# Patient Record
Sex: Male | Born: 1960 | Hispanic: No | Marital: Married | Smoking: Never smoker
Health system: Southern US, Community
[De-identification: ages and names within clinical notes are randomized; demographics above are authoritative.]

## PROBLEM LIST (undated history)

## (undated) DIAGNOSIS — R3129 Other microscopic hematuria: Secondary | ICD-10-CM

## (undated) DIAGNOSIS — E291 Testicular hypofunction: Secondary | ICD-10-CM

## (undated) DIAGNOSIS — R918 Other nonspecific abnormal finding of lung field: Secondary | ICD-10-CM

## (undated) DIAGNOSIS — E785 Hyperlipidemia, unspecified: Secondary | ICD-10-CM

## (undated) DIAGNOSIS — E039 Hypothyroidism, unspecified: Secondary | ICD-10-CM

## (undated) HISTORY — DX: Other nonspecific abnormal finding of lung field: R91.8

## (undated) HISTORY — DX: Other microscopic hematuria: R31.29

## (undated) HISTORY — DX: Testicular hypofunction: E29.1

## (undated) HISTORY — DX: Hypothyroidism, unspecified: E03.9

## (undated) HISTORY — DX: Hyperlipidemia, unspecified: E78.5

---

## 2013-08-30 ENCOUNTER — Encounter: Payer: Self-pay | Admitting: Sports Medicine

## 2013-08-30 ENCOUNTER — Ambulatory Visit (INDEPENDENT_AMBULATORY_CARE_PROVIDER_SITE_OTHER): Payer: BC Managed Care – PPO | Admitting: Sports Medicine

## 2013-08-30 VITALS — BP 124/74 | HR 62 | Ht 67.0 in | Wt 189.0 lb

## 2013-08-30 DIAGNOSIS — Z0000 Encounter for general adult medical examination without abnormal findings: Secondary | ICD-10-CM | POA: Insufficient documentation

## 2013-08-30 DIAGNOSIS — E291 Testicular hypofunction: Secondary | ICD-10-CM

## 2013-08-30 DIAGNOSIS — E038 Other specified hypothyroidism: Secondary | ICD-10-CM

## 2013-08-30 DIAGNOSIS — E039 Hypothyroidism, unspecified: Secondary | ICD-10-CM

## 2013-08-30 DIAGNOSIS — Z299 Encounter for prophylactic measures, unspecified: Secondary | ICD-10-CM

## 2013-08-30 DIAGNOSIS — H6123 Impacted cerumen, bilateral: Secondary | ICD-10-CM

## 2013-08-30 DIAGNOSIS — E785 Hyperlipidemia, unspecified: Secondary | ICD-10-CM

## 2013-08-30 DIAGNOSIS — H6120 Impacted cerumen, unspecified ear: Secondary | ICD-10-CM

## 2013-08-30 NOTE — Assessment & Plan Note (Signed)
Removal by physician with curette and flushing.

## 2013-08-30 NOTE — Progress Notes (Signed)
  Subjective:    CC: Establish care.   HPI:  This is a healthy 53 year old male, he had a colonoscopy last year. He has no complaints, he is adverse to taking those medications, but wants a complete set of lab tests.  Past medical history, Surgical history, Family history not pertinant except as noted below, Social history, Allergies, and medications have been entered into the medical record, reviewed, and no changes needed.   Review of Systems: No headache, visual changes, nausea, vomiting, diarrhea, constipation, dizziness, abdominal pain, skin rash, fevers, chills, night sweats, swollen lymph nodes, weight loss, chest pain, body aches, joint swelling, muscle aches, shortness of breath, mood changes, visual or auditory hallucinations.  Objective:    General: Well Developed, well nourished, and in no acute distress.  Neuro: Alert and oriented x3, extra-ocular muscles intact, sensation grossly intact.  HEENT: Normocephalic, atraumatic, pupils equal round reactive to light, neck supple, no masses, no lymphadenopathy, thyroid nonpalpable. Bilateral occlusion of both tympanic membranes with cerumen. Oropharynx and nasopharynx unremarkable. Skin: Warm and dry, no rashes noted.  Cardiac: Regular rate and rhythm, no murmurs rubs or gallops.  Respiratory: Clear to auscultation bilaterally. Not using accessory muscles, speaking in full sentences.  Abdominal: Soft, nontender, nondistended, positive bowel sounds, no masses, no organomegaly.  Musculoskeletal: Shoulder, elbow, wrist, hip, knee, ankle stable, and with full range of motion.  Indication: Cerumen impaction of the ear(s) Medical necessity statement: On physical examination, cerumen impairs clinically significant portions of the external auditory canal, and tympanic membrane. Noted obstructive, copious cerumen that cannot be removed without magnification and instrumentations requiring physician skills Consent: Discussed benefits and risks of  procedure and verbal consent obtained Procedure: Patient was prepped for the procedure. Utilized an otoscope to assess and take note of the ear canal, the tympanic membrane, and the presence, amount, and placement of the cerumen. Gentle water irrigation and soft plastic curette was utilized to remove cerumen.  Post procedure examination: shows cerumen was completely removed. Patient tolerated procedure well. The patient is made aware that they may experience temporary vertigo, temporary hearing loss, and temporary discomfort. If these symptom last for more than 24 hours to call the clinic or proceed to the ED.  Impression and Recommendations:    The patient was counselled, risk factors were discussed, anticipatory guidance given.

## 2013-08-30 NOTE — Assessment & Plan Note (Signed)
Up-to-date on colonoscopy last year. Checking routine blood work. He is going to come see me next week for a complete physical.

## 2013-09-02 LAB — COMPREHENSIVE METABOLIC PANEL
AST: 26 U/L (ref 0–37)
BUN: 19 mg/dL (ref 6–23)
CO2: 29 mEq/L (ref 19–32)
Calcium: 9.2 mg/dL (ref 8.4–10.5)
Chloride: 105 mEq/L (ref 96–112)
Creat: 1.27 mg/dL (ref 0.50–1.35)
Glucose, Bld: 102 mg/dL — ABNORMAL HIGH (ref 70–99)
Total Bilirubin: 0.7 mg/dL (ref 0.2–1.2)

## 2013-09-02 LAB — LIPID PANEL
Cholesterol: 204 mg/dL — ABNORMAL HIGH (ref 0–200)
HDL: 35 mg/dL — ABNORMAL LOW (ref >39)
LDL Cholesterol: 145 mg/dL — ABNORMAL HIGH (ref 0–99)
Total CHOL/HDL Ratio: 5.8 ratio
Triglycerides: 120 mg/dL (ref <150)
VLDL: 24 mg/dL (ref 0–40)

## 2013-09-02 LAB — CBC
HCT: 45.5 % (ref 39.0–52.0)
Hemoglobin: 15.6 g/dL (ref 13.0–17.0)
MCH: 30.4 pg (ref 26.0–34.0)
MCHC: 34.3 g/dL (ref 30.0–36.0)
MCV: 88.5 fL (ref 78.0–100.0)
Platelets: 193 K/uL (ref 150–400)
RBC: 5.14 MIL/uL (ref 4.22–5.81)
RDW: 13.3 % (ref 11.5–15.5)
WBC: 6.2 K/uL (ref 4.0–10.5)

## 2013-09-02 LAB — COMPREHENSIVE METABOLIC PANEL WITH GFR
ALT: 26 U/L (ref 0–53)
Albumin: 4.5 g/dL (ref 3.5–5.2)
Alkaline Phosphatase: 65 U/L (ref 39–117)
Potassium: 5.2 meq/L (ref 3.5–5.3)
Sodium: 139 meq/L (ref 135–145)
Total Protein: 6.6 g/dL (ref 6.0–8.3)

## 2013-09-03 DIAGNOSIS — E291 Testicular hypofunction: Secondary | ICD-10-CM | POA: Insufficient documentation

## 2013-09-03 DIAGNOSIS — E038 Other specified hypothyroidism: Secondary | ICD-10-CM

## 2013-09-03 DIAGNOSIS — E039 Hypothyroidism, unspecified: Secondary | ICD-10-CM | POA: Insufficient documentation

## 2013-09-03 DIAGNOSIS — E785 Hyperlipidemia, unspecified: Secondary | ICD-10-CM | POA: Insufficient documentation

## 2013-09-03 HISTORY — DX: Hyperlipidemia, unspecified: E78.5

## 2013-09-03 HISTORY — DX: Other specified hypothyroidism: E03.8

## 2013-09-03 HISTORY — DX: Testicular hypofunction: E29.1

## 2013-09-03 LAB — PSA, TOTAL AND FREE
PSA, Free Pct: 49 % (ref >25)
PSA, Free: 0.4 ng/mL
PSA: 0.82 ng/mL (ref <=4.00)

## 2013-09-03 LAB — TSH: TSH: 5.19 u[IU]/mL — ABNORMAL HIGH (ref 0.350–4.500)

## 2013-09-03 LAB — HEMOGLOBIN A1C
Hgb A1c MFr Bld: 5.5 % (ref <5.7)
Mean Plasma Glucose: 111 mg/dL (ref <117)

## 2013-09-03 LAB — TESTOSTERONE: Testosterone: 208 ng/dL — ABNORMAL LOW (ref 300–890)

## 2013-09-27 ENCOUNTER — Encounter: Payer: Self-pay | Admitting: Sports Medicine

## 2013-09-27 ENCOUNTER — Ambulatory Visit (INDEPENDENT_AMBULATORY_CARE_PROVIDER_SITE_OTHER): Payer: BC Managed Care – PPO | Admitting: Sports Medicine

## 2013-09-27 VITALS — BP 125/81 | HR 71 | Ht 67.0 in | Wt 173.0 lb

## 2013-09-27 DIAGNOSIS — E291 Testicular hypofunction: Secondary | ICD-10-CM

## 2013-09-27 DIAGNOSIS — H6120 Impacted cerumen, unspecified ear: Secondary | ICD-10-CM | POA: Diagnosis not present

## 2013-09-27 DIAGNOSIS — Z299 Encounter for prophylactic measures, unspecified: Secondary | ICD-10-CM

## 2013-09-27 DIAGNOSIS — Z1211 Encounter for screening for malignant neoplasm of colon: Secondary | ICD-10-CM

## 2013-09-27 DIAGNOSIS — E038 Other specified hypothyroidism: Secondary | ICD-10-CM

## 2013-09-27 DIAGNOSIS — Z0000 Encounter for general adult medical examination without abnormal findings: Secondary | ICD-10-CM | POA: Diagnosis not present

## 2013-09-27 DIAGNOSIS — H6123 Impacted cerumen, bilateral: Secondary | ICD-10-CM

## 2013-09-27 DIAGNOSIS — E039 Hypothyroidism, unspecified: Secondary | ICD-10-CM

## 2013-09-27 DIAGNOSIS — E785 Hyperlipidemia, unspecified: Secondary | ICD-10-CM

## 2013-09-27 MED ORDER — RED YEAST RICE 600 MG PO TABS: 2.0000 | ORAL_TABLET | Freq: Three times a day (TID) | ORAL | Status: AC

## 2013-09-27 NOTE — Assessment & Plan Note (Signed)
Asymptomatic, he does not desire to pursue treatment.

## 2013-09-27 NOTE — Assessment & Plan Note (Signed)
Asymptomatic, no treatment needed. Advised weight loss and resistance exercises.

## 2013-09-27 NOTE — Assessment & Plan Note (Signed)
Very mildly elevated. We are going to try red rice yeast extract and diet modification.

## 2013-09-27 NOTE — Progress Notes (Addendum)
  Subjective:    CC: Complete physical  HPI:  Hyperlipidemia: Desires to try dietary modification first.  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  Subclinical hypothyroidism: Declines treatment, we did discuss the risks and increased cardiovascular morbidity and mortality with untreated subclinical hyperthyroidism.  Past medical history, Surgical history, Family history not pertinant except as noted below, Social history, Allergies, and medications have been entered into the medical record, reviewed, and no changes needed.   Review of Systems: No headache, visual changes, nausea, vomiting, diarrhea, constipation, dizziness, abdominal pain, skin rash, fevers, chills, night sweats, swollen lymph nodes, weight loss, chest pain, body aches, joint swelling, muscle aches, shortness of breath, mood changes, visual or auditory hallucinations.  Objective:    General: Well Developed, well nourished, and in no acute distress.  Neuro: Alert and oriented x3, extra-ocular muscles intact, sensation grossly intact. Cranial nerves II through XII are intact, motor, sensory, and coordinative functions are all intact. HEENT: Normocephalic, atraumatic, pupils equal round reactive to light, neck supple, no masses, no lymphadenopathy, thyroid nonpalpable. Oropharynx, nasopharynx, external ear canals occluded with cerumen. Skin: Warm and dry, no rashes noted.  Cardiac: Regular rate and rhythm, no murmurs rubs or gallops.  Respiratory: Clear to auscultation bilaterally. Not using accessory muscles, speaking in full sentences.  Abdominal: Soft, nontender, nondistended, positive bowel sounds, no masses, no organomegaly.  Musculoskeletal: Shoulder, elbow, wrist, hip, knee, ankle stable, and with full range of motion. Rectal: Good tone, smooth prostate, Hemoccult negative.  Indication: Cerumen impaction of the ear(s) Medical necessity  statement: On physical examination, cerumen impairs clinically significant portions of the external auditory canal, and tympanic membrane. Noted obstructive, copious cerumen that cannot be removed without magnification and instrumentations requiring physician skills Consent: Discussed benefits and risks of procedure and verbal consent obtained Procedure: Patient was prepped for the procedure. Utilized an otoscope to assess and take note of the ear canal, the tympanic membrane, and the presence, amount, and placement of the cerumen. Gentle water irrigation and soft plastic curette was utilized to remove cerumen.  Post procedure examination: shows cerumen was completely removed. Patient tolerated procedure well. The patient is made aware that they may experience temporary vertigo, temporary hearing loss, and temporary discomfort. If these symptom last for more than 24 hours to call the clinic or proceed to the ED.  Impression and Recommendations:    The patient was counselled, risk factors were discussed, anticipatory guidance given.

## 2013-09-27 NOTE — Assessment & Plan Note (Signed)
Complete physical performed today. 

## 2013-09-27 NOTE — Assessment & Plan Note (Signed)
Repeat irrigation bilaterally.

## 2013-12-30 ENCOUNTER — Encounter: Payer: Self-pay | Admitting: Sports Medicine

## 2013-12-30 ENCOUNTER — Ambulatory Visit (INDEPENDENT_AMBULATORY_CARE_PROVIDER_SITE_OTHER): Payer: BC Managed Care – PPO | Admitting: Sports Medicine

## 2013-12-30 VITALS — BP 124/84 | HR 67 | Ht 67.0 in

## 2013-12-30 DIAGNOSIS — R3129 Other microscopic hematuria: Secondary | ICD-10-CM | POA: Insufficient documentation

## 2013-12-30 DIAGNOSIS — Z0000 Encounter for general adult medical examination without abnormal findings: Secondary | ICD-10-CM | POA: Diagnosis not present

## 2013-12-30 DIAGNOSIS — Z23 Encounter for immunization: Secondary | ICD-10-CM

## 2013-12-30 DIAGNOSIS — E785 Hyperlipidemia, unspecified: Secondary | ICD-10-CM | POA: Diagnosis not present

## 2013-12-30 DIAGNOSIS — R312 Other microscopic hematuria: Secondary | ICD-10-CM | POA: Diagnosis not present

## 2013-12-30 DIAGNOSIS — E038 Other specified hypothyroidism: Secondary | ICD-10-CM | POA: Diagnosis not present

## 2013-12-30 DIAGNOSIS — E039 Hypothyroidism, unspecified: Secondary | ICD-10-CM

## 2013-12-30 HISTORY — DX: Other microscopic hematuria: R31.29

## 2013-12-30 NOTE — Assessment & Plan Note (Signed)
Rechecking lipid panel 

## 2013-12-30 NOTE — Assessment & Plan Note (Signed)
TDap today. Declines influenza vaccine. Colonoscopy was in 2014.

## 2013-12-30 NOTE — Assessment & Plan Note (Addendum)
Urinalysis, bmet. If still positive we will CT scan.  Persistent, ordering CT with IV contrast.

## 2013-12-30 NOTE — Progress Notes (Signed)
Patient refused to get weight checked. Shaka Zech,CMA

## 2013-12-30 NOTE — Progress Notes (Signed)
  Subjective:    CC: follow-up  HPI: Microscopic hematuria: Noted in [place], has not had any imaging studies, he does desire to recheck his urine appropriately. He is asymptomatic.  Hyperlipidemia: Lonia FarberConstantine does desire a fairly minimalistic approach to this, he has declined statin medications, we have had him on red rice yeast extract, he is due for a recheck, and his fasting at this time.  He tells me his wife did the same regimen and her lipids decreased significantly.  Preventative measures: Colonoscopy was in 2014, due for tetanus vaccine, declines influenza vaccine.  Past medical history, Surgical history, Family history not pertinant except as noted below, Social history, Allergies, and medications have been entered into the medical record, reviewed, and no changes needed.   Review of Systems: No fevers, chills, night sweats, weight loss, chest pain, or shortness of breath.   Objective:    General: Well Developed, well nourished, and in no acute distress.  Neuro: Alert and oriented x3, extra-ocular muscles intact, sensation grossly intact.  HEENT: Normocephalic, atraumatic, pupils equal round reactive to light, neck supple, no masses, no lymphadenopathy, thyroid nonpalpable.  Skin: Warm and dry, no rashes. Cardiac: Regular rate and rhythm, no murmurs rubs or gallops, no lower extremity edema.  Respiratory: Clear to auscultation bilaterally. Not using accessory muscles, speaking in full sentences.  Impression and Recommendations:

## 2013-12-30 NOTE — Assessment & Plan Note (Signed)
Per patient request we did not treat this, he was informed of the cardiovascular mortality risk associated with untreated subclinical hypothyroidism. We will recheck his TSH today.

## 2013-12-31 ENCOUNTER — Telehealth: Payer: Self-pay | Admitting: *Deleted

## 2013-12-31 LAB — URINALYSIS
Bilirubin Urine: NEGATIVE
Glucose, UA: NEGATIVE mg/dL
Ketones, ur: NEGATIVE mg/dL
Leukocytes, UA: NEGATIVE
Nitrite: NEGATIVE
Protein, ur: 30 mg/dL — AB
Specific Gravity, Urine: 1.022 (ref 1.005–1.030)
Urobilinogen, UA: 0.2 mg/dL (ref 0.0–1.0)
pH: 5 (ref 5.0–8.0)

## 2013-12-31 LAB — BASIC METABOLIC PANEL WITH GFR
CO2: 29 meq/L (ref 19–32)
Chloride: 102 meq/L (ref 96–112)
Glucose, Bld: 93 mg/dL (ref 70–99)
Potassium: 5 meq/L (ref 3.5–5.3)
Sodium: 140 meq/L (ref 135–145)

## 2013-12-31 LAB — LIPID PANEL
Cholesterol: 228 mg/dL — ABNORMAL HIGH (ref 0–200)
HDL: 37 mg/dL — ABNORMAL LOW (ref >39)
LDL Cholesterol: 161 mg/dL — ABNORMAL HIGH (ref 0–99)
Total CHOL/HDL Ratio: 6.2 ratio
Triglycerides: 148 mg/dL (ref <150)
VLDL: 30 mg/dL (ref 0–40)

## 2013-12-31 LAB — T4, FREE: Free T4: 1.33 ng/dL (ref 0.80–1.80)

## 2013-12-31 LAB — TSH: TSH: 6.126 u[IU]/mL — ABNORMAL HIGH (ref 0.350–4.500)

## 2013-12-31 LAB — T3, FREE: T3, Free: 3.7 pg/mL (ref 2.3–4.2)

## 2013-12-31 LAB — BASIC METABOLIC PANEL
BUN: 18 mg/dL (ref 6–23)
Calcium: 9.7 mg/dL (ref 8.4–10.5)
Creat: 1.08 mg/dL (ref 0.50–1.35)

## 2013-12-31 NOTE — Addendum Note (Signed)
Addended by: Monica BectonHEKKEKANDAM, THOMAS J on: 12/31/2013 12:51 PM   Modules accepted: Orders

## 2013-12-31 NOTE — Telephone Encounter (Signed)
Ct approval [id number] expires 01/29/14. Corliss SkainsJamie Ramiz Turpin, CMA

## 2014-01-24 ENCOUNTER — Encounter: Payer: Self-pay | Admitting: Sports Medicine

## 2014-01-24 ENCOUNTER — Ambulatory Visit (INDEPENDENT_AMBULATORY_CARE_PROVIDER_SITE_OTHER): Payer: BC Managed Care – PPO

## 2014-01-24 DIAGNOSIS — R918 Other nonspecific abnormal finding of lung field: Secondary | ICD-10-CM | POA: Insufficient documentation

## 2014-01-24 DIAGNOSIS — R3129 Other microscopic hematuria: Secondary | ICD-10-CM

## 2014-01-24 DIAGNOSIS — N281 Cyst of kidney, acquired: Secondary | ICD-10-CM

## 2014-01-24 HISTORY — DX: Other nonspecific abnormal finding of lung field: R91.8

## 2014-01-24 MED ORDER — IOHEXOL 300 MG/ML  SOLN
100.0000 mL | Freq: Once | INTRAMUSCULAR | Status: AC | PRN
  Administered 2014-01-24: 100 mL via INTRAVENOUS

## 2014-04-01 ENCOUNTER — Encounter: Payer: Self-pay | Admitting: Sports Medicine

## 2014-04-01 ENCOUNTER — Ambulatory Visit (INDEPENDENT_AMBULATORY_CARE_PROVIDER_SITE_OTHER): Payer: BLUE CROSS/BLUE SHIELD | Admitting: Sports Medicine

## 2014-04-01 VITALS — BP 115/74 | HR 67 | Ht 67.0 in | Wt 172.0 lb

## 2014-04-01 DIAGNOSIS — R312 Other microscopic hematuria: Secondary | ICD-10-CM | POA: Diagnosis not present

## 2014-04-01 DIAGNOSIS — E039 Hypothyroidism, unspecified: Secondary | ICD-10-CM

## 2014-04-01 DIAGNOSIS — R918 Other nonspecific abnormal finding of lung field: Secondary | ICD-10-CM

## 2014-04-01 DIAGNOSIS — E785 Hyperlipidemia, unspecified: Secondary | ICD-10-CM | POA: Diagnosis not present

## 2014-04-01 DIAGNOSIS — E038 Other specified hypothyroidism: Secondary | ICD-10-CM | POA: Diagnosis not present

## 2014-04-01 DIAGNOSIS — R3129 Other microscopic hematuria: Secondary | ICD-10-CM

## 2014-04-01 MED ORDER — ATORVASTATIN CALCIUM 20 MG PO TABS: 20.0000 mg | ORAL_TABLET | Freq: Every day | ORAL | Status: DC

## 2014-04-01 NOTE — Assessment & Plan Note (Signed)
Several renal cysts on CT scan, due to the likely cause of his hematuria, no suspicious normalities.

## 2014-04-01 NOTE — Assessment & Plan Note (Signed)
Normal T3 and T4, no further intervention needed, patient is fully aware of the increase in cardiovascular mortality with untreated subclinical hypothyroidism.

## 2014-04-01 NOTE — Progress Notes (Signed)
  Subjective:    CC: Follow-up  HPI: Pulmonary nodules: Nonsmoker, repeat CT scan at the end of this year.  Microscopic hematuria: CT scan did show some renal cysts, no evidence of malignancy.  Hyperlipidemia: Cholesterol has increased despite a low cholesterol diet. Amenable to try Lipitor.  Past medical history, Surgical history, Family history not pertinant except as noted below, Social history, Allergies, and medications have been entered into the medical record, reviewed, and no changes needed.   Review of Systems: No fevers, chills, night sweats, weight loss, chest pain, or shortness of breath.   Objective:    General: Well Developed, well nourished, and in no acute distress.  Neuro: Alert and oriented x3, extra-ocular muscles intact, sensation grossly intact.  HEENT: Normocephalic, atraumatic, pupils equal round reactive to light, neck supple, no masses, no lymphadenopathy, thyroid nonpalpable.  Skin: Warm and dry, no rashes. Cardiac: Regular rate and rhythm, no murmurs rubs or gallops, no lower extremity edema.  Respiratory: Clear to auscultation bilaterally. Not using accessory muscles, speaking in full sentences.  Impression and Recommendations:

## 2014-04-01 NOTE — Assessment & Plan Note (Signed)
It has now been a long time, lipids continue to be elevated. Red rice yeast extract has not been effective. He finally agrees to start Lipitor. Recheck lipids in 3 months.

## 2014-04-01 NOTE — Assessment & Plan Note (Signed)
Will need a repeat CT chest in December 2016

## 2014-04-02 LAB — COMPREHENSIVE METABOLIC PANEL
Albumin: 4.8 g/dL (ref 3.5–5.2)
Alkaline Phosphatase: 63 U/L (ref 39–117)
BUN: 20 mg/dL (ref 6–23)
CO2: 33 mEq/L — ABNORMAL HIGH (ref 19–32)
Calcium: 9.7 mg/dL (ref 8.4–10.5)
Chloride: 102 mEq/L (ref 96–112)
Glucose, Bld: 87 mg/dL (ref 70–99)
Potassium: 4.8 mEq/L (ref 3.5–5.3)
Sodium: 141 mEq/L (ref 135–145)
Total Bilirubin: 0.8 mg/dL (ref 0.2–1.2)
Total Protein: 6.8 g/dL (ref 6.0–8.3)

## 2014-04-02 LAB — LIPID PANEL
Cholesterol: 210 mg/dL — ABNORMAL HIGH (ref 0–200)
HDL: 41 mg/dL (ref >39)
LDL Cholesterol: 154 mg/dL — ABNORMAL HIGH (ref 0–99)
Total CHOL/HDL Ratio: 5.1 ratio
Triglycerides: 75 mg/dL (ref <150)
VLDL: 15 mg/dL (ref 0–40)

## 2014-04-02 LAB — COMPREHENSIVE METABOLIC PANEL WITH GFR
ALT: 27 U/L (ref 0–53)
AST: 30 U/L (ref 0–37)
Creat: 1.08 mg/dL (ref 0.50–1.35)

## 2014-05-12 ENCOUNTER — Telehealth: Payer: Self-pay | Admitting: *Deleted

## 2014-05-12 ENCOUNTER — Ambulatory Visit (INDEPENDENT_AMBULATORY_CARE_PROVIDER_SITE_OTHER): Payer: BLUE CROSS/BLUE SHIELD | Admitting: Family Medicine

## 2014-05-12 ENCOUNTER — Encounter: Payer: Self-pay | Admitting: Family Medicine

## 2014-05-12 VITALS — BP 119/71 | HR 68 | Wt 172.0 lb

## 2014-05-12 DIAGNOSIS — K648 Other hemorrhoids: Secondary | ICD-10-CM

## 2014-05-12 DIAGNOSIS — K644 Residual hemorrhoidal skin tags: Secondary | ICD-10-CM

## 2014-05-12 MED ORDER — WITCH HAZEL-GLYCERIN EX PADS
1.0000 | MEDICATED_PAD | CUTANEOUS | Status: AC | PRN
Start: 2014-05-12 — End: ?

## 2014-05-12 MED ORDER — HYDROCORTISONE ACETATE 25 MG RE SUPP: 25.0000 mg | Freq: Two times a day (BID) | RECTAL | Status: AC | PRN

## 2014-05-12 MED ORDER — HYDROCORTISONE ACE-PRAMOXINE 1-1 % RE FOAM: 1.0000 | Freq: Two times a day (BID) | RECTAL | Status: DC

## 2014-05-12 NOTE — Telephone Encounter (Signed)
Switch to Anusol HC suppository and topical witch hazel wipes, he should also use a stool softener such as Colace 100 mg twice a day if he strains at all to stool.

## 2014-05-12 NOTE — Patient Instructions (Signed)
Begin taking 1-3 heaping teaspoons of Psyllium powder mixed in water (available over the counter) for the next week.  Also take two warm baths for at least 10 minutes daily to help shrink the site of swelling.  A Rx of a anti-inflammatory was sent to [facility].

## 2014-05-12 NOTE — Telephone Encounter (Signed)
Pharm called stating that the procto-foam is requiring a PA.  Do you want to change this to something different or us work on the PA? Please advise.

## 2014-05-12 NOTE — Progress Notes (Signed)
CC: Dale Ruiz is a 54 y.o. male is here for piles   Subjective: HPI:  Swelling and tenderness at the rectum that has been present for one week. It's worse with sitting on the rear end or when manipulating the mass at the rectum. It seems to be soft but very tender. No benefit from preparation H topical wipes. No other interventions as of yet. He denies straining or constipation with respect to defecation. There's been no abdominal pain. He denies any surrounding skin lesions or diarrhea. He denies any blood in the stool nor melena. Denies any abdominal discomfort. He tells me he had a colonoscopy within the last year that showed internal hemorrhoids and no other abnormality that he knows of.   Review Of Systems Outlined In HPI  No past medical history on file.  No past surgical history on file. No family history on file.  History   Social History  . Marital Status: Married    Spouse Name: N/A  . Number of Children: N/A  . Years of Education: N/A   Occupational History  . Not on file.   Social History Main Topics  . Smoking status: Never Smoker   . Smokeless tobacco: Not on file  . Alcohol Use: Not on file  . Drug Use: Not on file  . Sexual Activity: Not on file   Other Topics Concern  . Not on file   Social History Narrative     Objective: BP 119/71 mmHg  Pulse 68  Wt 172 lb (78.019 kg)  Vital signs reviewed. General: Alert and Oriented, No Acute Distress HEENT: Pupils equal, round, reactive to light. Conjunctivae clear.  External ears unremarkable.  Moist mucous membranes. Lungs: Clear and comfortable work of breathing, speaking in full sentences without accessory muscle use. Cardiac: Regular rate and rhythm.  Neuro: CN II-XII grossly intact, gait normal. Extremities: No peripheral edema.  Strong peripheral pulses.  Mental Status: No depression, anxiety, nor agitation. Logical though process. Rectal: Normal rectal tone, at the 12:00 position of the anus  there is a tender soft compressible 1.5 cm diameter fleshycolored hernia.no evidence of thrombosis. Skin: Warm and dry.  Assessment & Plan: Tora KindredConstantin was seen today for piles.  Diagnoses and all orders for this visit:  External hemorrhoid Orders: -     Discontinue: hydrocortisone-pramoxine (PROCTOFOAM HC) rectal foam; Place 1 applicator rectally 2 (two) times daily. For one week.   External hemorrhoids: Discussed comfortable seating, increasing fiber specifically with psyllium powder, sitz baths, and topical hydrocortisone cream. Discussed it will take at least a week for this to improve if he can follow through with the above recommendations. If no benefit in 2 weeks follow-up with PCP first second opinion on management.  Return in about 2 weeks (around 05/26/2014) for with PCP if no better..Marland Kitchen

## 2014-06-30 ENCOUNTER — Ambulatory Visit (INDEPENDENT_AMBULATORY_CARE_PROVIDER_SITE_OTHER): Payer: BLUE CROSS/BLUE SHIELD | Admitting: Sports Medicine

## 2014-06-30 ENCOUNTER — Encounter: Payer: Self-pay | Admitting: Sports Medicine

## 2014-06-30 DIAGNOSIS — E785 Hyperlipidemia, unspecified: Secondary | ICD-10-CM | POA: Diagnosis not present

## 2014-06-30 NOTE — Assessment & Plan Note (Signed)
Has now been on Lipitor now for 3 months, rechecking fasting lipids.

## 2014-06-30 NOTE — Progress Notes (Signed)
  Subjective:    CC: follow-up  HPI: Hyperlipidemia: Doing well on atorvastatin, due for a recheck.  Past medical history, Surgical history, Family history not pertinant except as noted below, Social history, Allergies, and medications have been entered into the medical record, reviewed, and no changes needed.   Review of Systems: No fevers, chills, night sweats, weight loss, chest pain, or shortness of breath.   Objective:    General: Well Developed, well nourished, and in no acute distress.  Neuro: Alert and oriented x3, extra-ocular muscles intact, sensation grossly intact.  HEENT: Normocephalic, atraumatic, pupils equal round reactive to light, neck supple, no masses, no lymphadenopathy, thyroid nonpalpable.  Skin: Warm and dry, no rashes. Cardiac: Regular rate and rhythm, no murmurs rubs or gallops, no lower extremity edema.  Respiratory: Clear to auscultation bilaterally. Not using accessory muscles, speaking in full sentences.  Impression and Recommendations:    I spent 40 minutes with this patient, 50% was face-to-face time counseling regarding the above diagnosis,as well as discussing aspects of his treatment, as well as future courses.

## 2014-07-02 LAB — LIPID PANEL
Cholesterol: 185 mg/dL (ref 0–200)
HDL: 40 mg/dL (ref >=40)
LDL Cholesterol: 124 mg/dL — ABNORMAL HIGH (ref 0–99)
Total CHOL/HDL Ratio: 4.6 ratio
Triglycerides: 107 mg/dL (ref <150)
VLDL: 21 mg/dL (ref 0–40)

## 2014-07-02 LAB — COMPREHENSIVE METABOLIC PANEL WITH GFR
ALT: 25 U/L (ref 0–53)
Albumin: 4.1 g/dL (ref 3.5–5.2)
Alkaline Phosphatase: 52 U/L (ref 39–117)
BUN: 20 mg/dL (ref 6–23)
Calcium: 9.3 mg/dL (ref 8.4–10.5)
Creat: 0.95 mg/dL (ref 0.50–1.35)

## 2014-07-02 LAB — COMPREHENSIVE METABOLIC PANEL
AST: 29 U/L (ref 0–37)
CO2: 26 mEq/L (ref 19–32)
Chloride: 106 mEq/L (ref 96–112)
Glucose, Bld: 121 mg/dL — ABNORMAL HIGH (ref 70–99)
Potassium: 4.8 mEq/L (ref 3.5–5.3)
Sodium: 142 mEq/L (ref 135–145)
Total Bilirubin: 0.6 mg/dL (ref 0.2–1.2)
Total Protein: 6.5 g/dL (ref 6.0–8.3)

## 2014-09-30 ENCOUNTER — Ambulatory Visit: Payer: BLUE CROSS/BLUE SHIELD | Admitting: Sports Medicine

## 2014-10-13 ENCOUNTER — Ambulatory Visit (INDEPENDENT_AMBULATORY_CARE_PROVIDER_SITE_OTHER): Payer: BLUE CROSS/BLUE SHIELD | Admitting: Sports Medicine

## 2014-10-13 ENCOUNTER — Encounter: Payer: Self-pay | Admitting: Sports Medicine

## 2014-10-13 VITALS — BP 110/70 | HR 65 | Ht 67.0 in | Wt 171.0 lb

## 2014-10-13 DIAGNOSIS — E785 Hyperlipidemia, unspecified: Secondary | ICD-10-CM | POA: Diagnosis not present

## 2014-10-13 MED ORDER — ATORVASTATIN CALCIUM 20 MG PO TABS: 20.0000 mg | ORAL_TABLET | Freq: Every day | ORAL | Status: AC

## 2014-10-13 NOTE — Assessment & Plan Note (Signed)
Doing extremely well, lipids have essentially normalized on 20 mg of Lipitor. Liver function is normal. Return in one year.

## 2014-10-13 NOTE — Progress Notes (Signed)
  Subjective:    CC: Follow-up  HPI: Hyperlipidemia: We discussed lipids, he is doing well on Lipitor, no refills needed, no further questions.  Past medical history, Surgical history, Family history not pertinant except as noted below, Social history, Allergies, and medications have been entered into the medical record, reviewed, and no changes needed.   Review of Systems: No fevers, chills, night sweats, weight loss, chest pain, or shortness of breath.   Objective:    General: Well Developed, well nourished, and in no acute distress.  Neuro: Alert and oriented x3, extra-ocular muscles intact, sensation grossly intact.  HEENT: Normocephalic, atraumatic, pupils equal round reactive to light, neck supple, no masses, no lymphadenopathy, thyroid nonpalpable.  Skin: Warm and dry, no rashes. Cardiac: Regular rate and rhythm, no murmurs rubs or gallops, no lower extremity edema.  Respiratory: Clear to auscultation bilaterally. Not using accessory muscles, speaking in full sentences.  Impression and Recommendations:

## 2015-03-12 ENCOUNTER — Encounter: Payer: Self-pay | Admitting: Sports Medicine

## 2015-03-12 ENCOUNTER — Ambulatory Visit (INDEPENDENT_AMBULATORY_CARE_PROVIDER_SITE_OTHER): Payer: BLUE CROSS/BLUE SHIELD

## 2015-03-12 ENCOUNTER — Ambulatory Visit (INDEPENDENT_AMBULATORY_CARE_PROVIDER_SITE_OTHER): Payer: BLUE CROSS/BLUE SHIELD | Admitting: Sports Medicine

## 2015-03-12 VITALS — BP 124/78 | HR 74 | Temp 97.9°F | Resp 18 | Wt 186.4 lb

## 2015-03-12 DIAGNOSIS — R059 Cough, unspecified: Secondary | ICD-10-CM

## 2015-03-12 DIAGNOSIS — H6123 Impacted cerumen, bilateral: Secondary | ICD-10-CM | POA: Diagnosis not present

## 2015-03-12 DIAGNOSIS — R05 Cough: Secondary | ICD-10-CM

## 2015-03-12 MED ORDER — PREDNISONE 50 MG PO TABS: ORAL_TABLET | ORAL | Status: DC

## 2015-03-12 MED ORDER — AZITHROMYCIN 250 MG PO TABS: ORAL_TABLET | ORAL | Status: DC

## 2015-03-12 NOTE — Progress Notes (Signed)
  Subjective:    CC: coughing  HPI: This is a pleasant 55 year old male, he comes in with a 4 week history of a dry, nonproductive cough, no fevers, chills, shortness of breath, chest pain. No rashes, no travel outside country. No night sweats.  Past medical history, Surgical history, Family history not pertinant except as noted below, Social history, Allergies, and medications have been entered into the medical record, reviewed, and no changes needed.   Review of Systems: No fevers, chills, night sweats, weight loss, chest pain, or shortness of breath.   Objective:    General: Well Developed, well nourished, and in no acute distress.  Neuro: Alert and oriented x3, extra-ocular muscles intact, sensation grossly intact.  HEENT: Normocephalic, atraumatic, pupils equal round reactive to light, neck supple, no masses, no lymphadenopathy, thyroid nonpalpable. Oropharynx, nasopharynx unremarkable, ear canal show cerumen occlusions Skin: Warm and dry, no rashes. Cardiac: Regular rate and rhythm, no murmurs rubs or gallops, no lower extremity edema.  Respiratory: coarse sounds in the right middle lobe. Not using accessory muscles, speaking in full sentences.  Indication: Cerumen impaction of the left and right ear(s) Medical necessity statement: On physical examination, cerumen impairs clinically significant portions of the external auditory canal, and tympanic membrane. Noted obstructive, copious cerumen that cannot be removed without magnification and instrumentations requiring physician skills Consent: Discussed benefits and risks of procedure and verbal consent obtained Procedure: Patient was prepped for the procedure. Utilized an otoscope to assess and take note of the ear canal, the tympanic membrane, and the presence, amount, and placement of the cerumen. Gentle water irrigation and soft plastic curette was utilized to remove cerumen.  Post procedure examination: shows cerumen was completely  removed. Patient tolerated procedure well. The patient is made aware that they may experience temporary vertigo, temporary hearing loss, and temporary discomfort. If these symptom last for more than 24 hours to call the clinic or proceed to the ED. Impression and Recommendations:

## 2015-03-12 NOTE — Assessment & Plan Note (Signed)
With right middle lobe coarse sounds. Present for one month, adding azithromycin, prednisone, chest x-ray. Return in 2 weeks, if no better we will consider advanced imaging and PFTs  He does have fairly marked bilateral cerumen impactions which may represent Arnold's reflex.

## 2015-03-26 ENCOUNTER — Ambulatory Visit (INDEPENDENT_AMBULATORY_CARE_PROVIDER_SITE_OTHER): Payer: BLUE CROSS/BLUE SHIELD | Admitting: Sports Medicine

## 2015-03-26 DIAGNOSIS — R05 Cough: Secondary | ICD-10-CM

## 2015-03-26 DIAGNOSIS — R059 Cough, unspecified: Secondary | ICD-10-CM

## 2015-03-26 MED ORDER — TIOTROPIUM BROMIDE-OLODATEROL 2.5-2.5 MCG/ACT IN AERS: 2.0000 | INHALATION_SPRAY | Freq: Every day | RESPIRATORY_TRACT | Status: DC

## 2015-03-26 MED ORDER — BENZONATATE 200 MG PO CAPS: 200.0000 mg | ORAL_CAPSULE | Freq: Three times a day (TID) | ORAL | Status: DC | PRN

## 2015-03-26 NOTE — Progress Notes (Signed)
  Subjective:    CC: Cough  HPI: Patient returns to clinic 1 month after receiving a Z-pack and steroid for a cough that had lasted 1 month at that point (2 months total to date). Patient says that his cough has not changed in quality or quantity and has not improved with previous regimen. He notices it the most when he steps in the cold air or when he drinks cold liquids. However, he does the have cough consistently when not exposed the cold. He does not notice that it is worse in the morning and denies any heart burn or chest pain of any kind. He says that occasionally bread feels like it "may get stuck" in his throat but no problems with other foods or with liquids. He denies fevers, weight loss, night sweats, or N/V. He denies a history of allergies, asthma, or cigarette use. He does not feel like he has a runny or stuffy nose. He denies any SOB.  Past medical history, Surgical history, Family history not pertinant except as noted below, Social history, Allergies, and medications have been entered into the medical record, reviewed, and no changes needed.   Review of Systems: No headache, visual changes, weakness, muscle aches, diarrhea, or constipation.  Objective:    General: Well Developed, well nourished, and in no acute distress.  Neuro: Alert and oriented x3, extra-ocular muscles intact, sensation grossly intact.  HEENT: Normocephalic, atraumatic, pupils equal round reactive to light, neck supple, no masses, no lymphadenopathy, thyroid nonpalpable, mild erythema of the nares, mild oropharyngeal erythema.  Skin: Warm and dry, no rashes. Cardiac: Regular rate and rhythm, no murmurs rubs or gallops, no lower extremity edema.  Respiratory: Clear to auscultation bilaterally. Not using accessory muscles, speaking in full sentences. Abdominal: NT/ND. Normoactive bowel sounds.  Impression and Recommendations:    Unclear etiology, however most likely post infectious vs. reactive ariway disease.  Patient given a Spiolto sample today and instructed to take 2 puffs/morning. GERD less likely given lack of N/V, chest pain, and association with recumbency or meals. Would consider more work up should patient not improve with time and therapy. No concern for malignancy at this time (no fevers, night sweats, or weight loss).

## 2015-03-26 NOTE — Assessment & Plan Note (Signed)
Persistent now for 2 months, unclear etiology however exam is benign, chest x-ray was normal. He did not respond to azithromycin or prednisone, this likely represents a simple postinfectious cough versus reactive airways versus upper airway cough syndrome. No symptoms of reflux, aspiration. No constitutional symptoms. Stiolto inhaler provided today, return to see me in one month. If still coughing we will probably consider pulmonology referral.

## 2015-08-17 ENCOUNTER — Encounter: Payer: Self-pay | Admitting: Sports Medicine

## 2015-08-17 ENCOUNTER — Ambulatory Visit (INDEPENDENT_AMBULATORY_CARE_PROVIDER_SITE_OTHER): Payer: BLUE CROSS/BLUE SHIELD | Admitting: Sports Medicine

## 2015-08-17 VITALS — BP 108/76 | HR 69 | Resp 18 | Wt 174.3 lb

## 2015-08-17 DIAGNOSIS — L237 Allergic contact dermatitis due to plants, except food: Secondary | ICD-10-CM | POA: Diagnosis not present

## 2015-08-17 MED ORDER — TRIAMCINOLONE ACETONIDE 0.5 % EX OINT: 1.0000 "application " | TOPICAL_OINTMENT | Freq: Two times a day (BID) | CUTANEOUS | Status: AC

## 2015-08-17 NOTE — Progress Notes (Signed)
  Subjective:    CC: Rash  HPI: 1 week ago this pleasant 55 year old male was working in his yard, he was wearing short sleeves and over the last week developed itching, and a reddish rash along the entirety of his arm. Symptoms are moderate, persistent, itching does not wake him up at night. He's tried some home remedies without much improvement.  Past medical history, Surgical history, Family history not pertinant except as noted below, Social history, Allergies, and medications have been entered into the medical record, reviewed, and no changes needed.   Review of Systems: No fevers, chills, night sweats, weight loss, chest pain, or shortness of breath.   Objective:    General: Well Developed, well nourished, and in no acute distress.  Neuro: Alert and oriented x3, extra-ocular muscles intact, sensation grossly intact.  HEENT: Normocephalic, atraumatic, pupils equal round reactive to light, neck supple, no masses, no lymphadenopathy, thyroid nonpalpable.  Skin: Warm and dry, Papules and macules in a linear fashion on the right arm consistent with poison ivy dermatitis, no evidence of bacterial superinfection. Cardiac: Regular rate and rhythm, no murmurs rubs or gallops, no lower extremity edema.  Respiratory: Clear to auscultation bilaterally. Not using accessory muscles, speaking in full sentences.  Impression and Recommendations:   I spent 25 minutes with this patient, greater than 50% was face-to-face time counseling regarding the above diagnoses

## 2015-08-17 NOTE — Patient Instructions (Signed)

## 2015-08-17 NOTE — Assessment & Plan Note (Signed)
Right arm and fairly extensive, adding triamcinolone ointment.

## 2015-08-19 ENCOUNTER — Encounter: Payer: Self-pay | Admitting: Sports Medicine

## 2015-08-19 MED ORDER — PREDNISONE 50 MG PO TABS: 50.0000 mg | ORAL_TABLET | Freq: Every day | ORAL | Status: DC

## 2015-08-19 NOTE — Telephone Encounter (Signed)
Pt advised of Rx sent in per PCP request.

## 2016-03-16 ENCOUNTER — Encounter: Payer: Self-pay | Admitting: Osteopathic Medicine

## 2016-03-16 ENCOUNTER — Ambulatory Visit (INDEPENDENT_AMBULATORY_CARE_PROVIDER_SITE_OTHER): Payer: BLUE CROSS/BLUE SHIELD | Admitting: Osteopathic Medicine

## 2016-03-16 VITALS — BP 127/70 | HR 80 | Temp 98.2°F

## 2016-03-16 DIAGNOSIS — R05 Cough: Secondary | ICD-10-CM

## 2016-03-16 DIAGNOSIS — J209 Acute bronchitis, unspecified: Secondary | ICD-10-CM

## 2016-03-16 DIAGNOSIS — R918 Other nonspecific abnormal finding of lung field: Secondary | ICD-10-CM | POA: Diagnosis not present

## 2016-03-16 DIAGNOSIS — R059 Cough, unspecified: Secondary | ICD-10-CM

## 2016-03-16 MED ORDER — TIOTROPIUM BROMIDE-OLODATEROL 2.5-2.5 MCG/ACT IN AERS: 2.0000 | INHALATION_SPRAY | Freq: Every day | RESPIRATORY_TRACT | 1 refills | Status: DC

## 2016-03-16 MED ORDER — BENZONATATE 200 MG PO CAPS: 200.0000 mg | ORAL_CAPSULE | Freq: Three times a day (TID) | ORAL | 1 refills | Status: DC | PRN

## 2016-03-16 NOTE — Progress Notes (Signed)
HPI: Dale Ruiz is a 56 y.o. male who [facility] [place] [facility] for chief complaint of:  Chief Complaint  Patient presents with  . Cough   Acute Illness: . Context: thinks may have gotten sick from sister . Quality: dry cough . Assoc signs/symptoms: see ROS . Duration: 14+ days . Modifying factors: has tried the following OTC/Rx medications: OTC cough medications have done little. Pt was given Stiolto 03/2015 for cough and this was helpful   Pulmonary nodule: Chronic issue: problem list reviewed, hx incidental pulm nodule finding on Abd/Pelv CT which was done for w/u hematuria - overdue for recheck CT. Never smoker.    Past medical, social and family history reviewed. Current medications and allergies reviewed.     Review of Systems:  Constitutional: No  fever/chills  HEENT: No  headache, No  sore throat, No  swollen glands  Cardiovascular: No chest pain  Respiratory:Yes  cough, No  shortness of breath  Gastrointestinal: No  nausea, No  vomiting,  No  diarrhea  Musculoskeletal:   No  myalgia/arthralgia  Skin/Integument:  No  rash   Exam:  BP 127/70   Pulse 80   Temp 98.2 F (36.8 C) (Oral)   Constitutional: VSS, see above. General Appearance: alert, well-developed, well-nourished, NAD  Eyes: Normal lids and conjunctive, non-icteric sclera, PERRLA  Ears, Nose, Mouth, Throat: Normal external inspection ears/nares/mouth/lips/gums, normal TM, MMM; posterior pharynx without erythema, without exudate, nasal mucosa normal  Neck: No masses, trachea midline. normal lymph nodes  Respiratory: Normal respiratory effort. No  wheeze/rhonchi/rales  Cardiovascular: S1/S2 normal, no murmur/rub/gallop auscultated. RRR.   CT images reviewed with the patient. All question answered.    ASSESSMENT/PLAN:  Acute bronchitis, unspecified organism - viral likely, pt requests Stiotlo wihch helped last time.   Cough - Plan:  Tiotropium Bromide-Olodaterol (STIOLTO RESPIMAT) 2.5-2.5 MCG/ACT AERS  Pulmonary nodules - unlikely related to current complaint but he is overdue for followup CT chest - incidental nodule finding on CT abd/pelvis in 01/2014 - Plan: CT CHEST WO CONTRAST    Visit summary was printed for the patient with medications and pertinent instructions for patient to review. ER/RTC precautions reviewed. All questions answered. Return if symptoms worsen or fail to improve. and is due for routine care with PCP

## 2016-03-16 NOTE — Patient Instructions (Signed)

## 2016-03-17 MED ORDER — TIOTROPIUM BROMIDE-OLODATEROL 2.5-2.5 MCG/ACT IN AERS: 2.0000 | INHALATION_SPRAY | Freq: Every day | RESPIRATORY_TRACT | 1 refills | Status: AC

## 2016-03-17 NOTE — Addendum Note (Signed)
Addended by: Deirdre PippinsALEXANDER, Kory Rains M on: 03/17/2016 08:43 AM   Modules accepted: Orders

## 2016-03-21 ENCOUNTER — Ambulatory Visit (INDEPENDENT_AMBULATORY_CARE_PROVIDER_SITE_OTHER): Payer: BLUE CROSS/BLUE SHIELD

## 2016-03-21 DIAGNOSIS — R918 Other nonspecific abnormal finding of lung field: Secondary | ICD-10-CM | POA: Diagnosis not present

## 2016-03-28 ENCOUNTER — Ambulatory Visit (INDEPENDENT_AMBULATORY_CARE_PROVIDER_SITE_OTHER): Payer: BLUE CROSS/BLUE SHIELD | Admitting: Sports Medicine

## 2016-03-28 ENCOUNTER — Encounter: Payer: Self-pay | Admitting: Sports Medicine

## 2016-03-28 VITALS — BP 113/73 | HR 89 | Resp 18 | Wt 166.5 lb

## 2016-03-28 DIAGNOSIS — Z0000 Encounter for general adult medical examination without abnormal findings: Secondary | ICD-10-CM

## 2016-03-28 DIAGNOSIS — Z1211 Encounter for screening for malignant neoplasm of colon: Secondary | ICD-10-CM

## 2016-03-28 DIAGNOSIS — E039 Hypothyroidism, unspecified: Secondary | ICD-10-CM | POA: Diagnosis not present

## 2016-03-28 DIAGNOSIS — E038 Other specified hypothyroidism: Secondary | ICD-10-CM

## 2016-03-28 LAB — HEMOCCULT GUIAC POC 1CARD (OFFICE)
Card #1 Date: 20518
Fecal Occult Blood, POC: NEGATIVE

## 2016-03-28 NOTE — Assessment & Plan Note (Signed)
Annual physical as above, blood work ordered. Adding a PSA, prostate was nodular

## 2016-03-28 NOTE — Progress Notes (Signed)
  Subjective:    CC: Annual physical  HPI:  This is a pleasant [age] her old male, here for his physical, no complaints.  Past medical history:  Negative.  See flowsheet/record as well for more information.  Surgical history: Negative.  See flowsheet/record as well for more information.  Family history: Negative.  See flowsheet/record as well for more information.  Social history: Negative.  See flowsheet/record as well for more information.  Allergies, and medications have been entered into the medical record, reviewed, and no changes needed.    Review of Systems: No headache, visual changes, nausea, vomiting, diarrhea, constipation, dizziness, abdominal pain, skin rash, fevers, chills, night sweats, swollen lymph nodes, weight loss, chest pain, body aches, joint swelling, muscle aches, shortness of breath, mood changes, visual or auditory hallucinations.  Objective:    General: Well Developed, well nourished, and in no acute distress.  Neuro: Alert and oriented x3, extra-ocular muscles intact, sensation grossly intact. Cranial nerves II through XII are intact, motor, sensory, and coordinative functions are all intact. HEENT: Normocephalic, atraumatic, pupils equal round reactive to light, neck supple, no masses, no lymphadenopathy, thyroid nonpalpable. Oropharynx, nasopharynx, external ear canals are unremarkable. Skin: Warm and dry, no rashes noted.  Cardiac: Regular rate and rhythm, no murmurs rubs or gallops.  Respiratory: Clear to auscultation bilaterally. Not using accessory muscles, speaking in full sentences.  Abdominal: Soft, nontender, nondistended, positive bowel sounds, no masses, no organomegaly.  Musculoskeletal: Shoulder, elbow, wrist, hip, knee, ankle stable, and with full range of motion. Rectal: Good tone, prostate soft and nodular, Hemoccult negative.  Impression and Recommendations:    The patient was counselled, risk factors were discussed, anticipatory guidance  given.  Annual physical exam Annual physical as above, blood work ordered. Adding a PSA, prostate was nodular

## 2016-03-29 LAB — COMPREHENSIVE METABOLIC PANEL WITH GFR
AST: 28 U/L (ref 10–35)
CO2: 25 mmol/L (ref 20–31)
Creat: 1.1 mg/dL (ref 0.70–1.33)
Glucose, Bld: 92 mg/dL (ref 65–99)
Sodium: 141 mmol/L (ref 135–146)
Total Bilirubin: 0.7 mg/dL (ref 0.2–1.2)

## 2016-03-29 LAB — COMPREHENSIVE METABOLIC PANEL
ALT: 19 U/L (ref 9–46)
Albumin: 4.4 g/dL (ref 3.6–5.1)
Alkaline Phosphatase: 48 U/L (ref 40–115)
BUN: 22 mg/dL (ref 7–25)
Calcium: 9.2 mg/dL (ref 8.6–10.3)
Chloride: 104 mmol/L (ref 98–110)
Potassium: 4.5 mmol/L (ref 3.5–5.3)
Total Protein: 6.6 g/dL (ref 6.1–8.1)

## 2016-03-29 LAB — CBC
HCT: 48.4 % (ref 38.5–50.0)
Hemoglobin: 16.2 g/dL (ref 13.2–17.1)
MCH: 30.3 pg (ref 27.0–33.0)
MCHC: 33.5 g/dL (ref 32.0–36.0)
MCV: 90.6 fL (ref 80.0–100.0)
MPV: 10.2 fL (ref 7.5–12.5)
Platelets: 182 K/uL (ref 140–400)
RBC: 5.34 MIL/uL (ref 4.20–5.80)
RDW: 13.1 % (ref 11.0–15.0)
WBC: 4.1 K/uL (ref 3.8–10.8)

## 2016-03-29 LAB — LIPID PANEL
Cholesterol: 213 mg/dL — ABNORMAL HIGH (ref <200)
HDL: 35 mg/dL — ABNORMAL LOW (ref >40)
LDL Cholesterol: 154 mg/dL — ABNORMAL HIGH (ref <100)
Total CHOL/HDL Ratio: 6.1 Ratio — ABNORMAL HIGH (ref <5.0)
Triglycerides: 120 mg/dL (ref <150)
VLDL: 24 mg/dL (ref <30)

## 2016-03-29 LAB — HEMOGLOBIN A1C
Hgb A1c MFr Bld: 5.2 % (ref <5.7)
Mean Plasma Glucose: 103 mg/dL

## 2016-03-29 LAB — TSH: TSH: 5.52 m[IU]/L — ABNORMAL HIGH (ref 0.40–4.50)

## 2016-03-30 LAB — HEPATITIS C ANTIBODY: HCV Ab: NEGATIVE

## 2016-03-30 LAB — PSA, TOTAL AND FREE
PSA, % Free: 57 % (ref >25)
PSA, Free: 0.4 ng/mL
PSA, Total: 0.7 ng/mL (ref <=4.0)

## 2016-03-30 LAB — HIV ANTIBODY (ROUTINE TESTING W REFLEX): HIV 1&2 Ab, 4th Generation: NONREACTIVE

## 2016-03-30 LAB — VITAMIN D 25 HYDROXY (VIT D DEFICIENCY, FRACTURES): Vit D, 25-Hydroxy: 30 ng/mL (ref 30–100)

## 2016-03-30 MED ORDER — LEVOTHYROXINE SODIUM 50 MCG PO TABS: 50.0000 ug | ORAL_TABLET | Freq: Every day | ORAL | 3 refills | Status: DC

## 2016-03-30 NOTE — Addendum Note (Signed)
Addended by: Monica BectonHEKKEKANDAM, Chirstine Defrain J on: 03/30/2016 02:03 PM   Modules accepted: Orders

## 2016-03-30 NOTE — Assessment & Plan Note (Signed)
Sending in 50 g of levothyroxine, recheck in 6 weeks.

## 2016-07-29 ENCOUNTER — Ambulatory Visit (INDEPENDENT_AMBULATORY_CARE_PROVIDER_SITE_OTHER): Payer: BLUE CROSS/BLUE SHIELD | Admitting: Physician Assistant

## 2016-07-29 ENCOUNTER — Encounter: Payer: Self-pay | Admitting: Physician Assistant

## 2016-07-29 VITALS — BP 107/76 | HR 83 | Temp 98.4°F | Wt 161.0 lb

## 2016-07-29 DIAGNOSIS — K047 Periapical abscess without sinus: Secondary | ICD-10-CM | POA: Diagnosis not present

## 2016-07-29 MED ORDER — CLINDAMYCIN HCL 150 MG PO CAPS: 150.0000 mg | ORAL_CAPSULE | Freq: Three times a day (TID) | ORAL | 0 refills | Status: AC

## 2016-07-29 NOTE — Progress Notes (Signed)
HPI:                                                                Dale Ruiz is a 56 y.o. male who [facility] today for dental pain  Patient presents today complaining of dental pain and myalgias two days after extraction of a left upper molar. Patient states he was not prescribed an antibiotic and he is concerned that he could have an infection. He endorses sweats and chills. He denies facial swelling, trismus, or bleeding.   Past Medical History:  Diagnosis Date  . Hyperlipidemia 09/03/2013  . Hypogonadism male 09/03/2013  . Microscopic hematuria 12/30/2013   CT scan shows renal cysts and no other concerning abnormality. This is the likely cause of the hematuria.   . Pulmonary nodules 01/24/2014   Needs repeat CT of the chest in December 2016   . Subclinical hypothyroidism 09/03/2013   No past surgical history on file. Social History  Substance Use Topics  . Smoking status: Never Smoker  . Smokeless tobacco: Never Used  . Alcohol use Not on file   family history is not on file.  ROS: negative except as noted in the HPI  Medications: Current Outpatient Prescriptions  Medication Sig Dispense Refill  . atorvastatin (LIPITOR) 20 MG tablet Take 1 tablet (20 mg total) by mouth daily at 6 PM. 90 tablet 3  . hydrocortisone (ANUSOL-HC) 25 MG suppository Place 1 suppository (25 mg total) rectally 2 (two) times daily as needed for hemorrhoids or itching. 24 suppository 2  . levothyroxine (SYNTHROID, LEVOTHROID) 50 MCG tablet Take 1 tablet (50 mcg total) by mouth daily. 30 tablet 3  . Red Yeast Rice 600 MG TABS Take 2 tablets (1,200 mg total) by mouth 3 (three) times daily with meals. 90 tablet 0  . Tiotropium Bromide-Olodaterol (STIOLTO RESPIMAT) 2.5-2.5 MCG/ACT AERS Inhale 2 puffs into the lungs daily. 1 Inhaler 1  . triamcinolone ointment (KENALOG) 0.5 % Apply 1 application topically 2 (two) times daily. To affected  area, avoid eyes and face 30 g 3  . witch hazel-glycerin (TUCKS) pad Apply 1 application topically as needed. 1 each 11  . clindamycin (CLEOCIN) 150 MG capsule Take 1 capsule (150 mg total) by mouth 3 (three) times daily. 15 capsule 0   No current facility-administered medications for this visit.    No Known Allergies     Objective:  BP 107/76   Pulse 83   Temp 98.4 F (36.9 C) (Oral)   Wt 161 lb (73 kg)   BMI 25.22 kg/m  Gen: well-groomed, cooperative, not ill-appearing, no distress HEENT: no facial edema, normal conjunctiva, oropharynx clear, left upper first molar absent, gum is mildly edematous, no bleeding or abscess, moist mucus membranes, neck supple, trachea midline Pulm: Normal work of breathing, normal phonation, clear to auscultation bilaterally, no wheezes, rales or rhonchi CV: Normal rate, regular rhythm, s1 and s2 distinct, no murmurs, clicks or rubs  Neuro: alert and oriented x 3, EOM's intact, no tremor MSK: moving all extremities, normal gait and station, no peripheral edema Lymph: no cervical or tonsillar adenopathy Skin: warm, dry, intact; no cyanosis   No results found for this or any previous visit (from the past 72 hour(s)). No  results found.    Assessment and Plan: 56 y.o. male with   1. Dental infection - patient does not meet SIRS criteria. Vital signs reviewed and normal - will cover for anaerobes and gram positive bacteria with Clindamycin - clindamycin (CLEOCIN) 150 MG capsule; Take 1 capsule (150 mg total) by mouth 3 (three) times daily.  Dispense: 15 capsule; Refill: 0   Patient education and anticipatory guidance given Patient agrees with treatment plan Follow-up as needed if symptoms worsen or fail to improve  Levonne Hubert PA-C

## 2016-07-29 NOTE — Patient Instructions (Addendum)
-   Take Clindamycin three times daily for 5 days   Dental Abscess A dental abscess is a collection of pus in or around a tooth. What are the causes? This condition is caused by a bacterial infection around the root of the tooth that involves the inner part of the tooth (pulp). It may result from:  Severe tooth decay.  Trauma to the tooth that allows bacteria to enter into the pulp, such as a broken or chipped tooth.  Severe gum disease around a tooth.  What are the signs or symptoms? Symptoms of this condition include:  Severe pain in and around the infected tooth.  Swelling and redness around the infected tooth, in the mouth, or in the face.  Tenderness.  Pus drainage.  Bad breath.  Bitter taste in the mouth.  Difficulty swallowing.  Difficulty opening the mouth.  Nausea.  Vomiting.  Chills.  Swollen neck glands.  Fever.  How is this diagnosed? This condition is diagnosed with examination of the infected tooth. During the exam, your dentist may tap on the infected tooth. Your dentist will also ask about your medical and dental history and may order X-rays. How is this treated? This condition is treated by eliminating the infection. This may be done with:  Antibiotic medicine.  A root canal. This may be performed to save the tooth.  Pulling (extracting) the tooth. This may also involve draining the abscess. This is done if the tooth cannot be saved.  Follow these instructions at home:  Take medicines only as directed by your dentist.  If you were prescribed antibiotic medicine, finish all of it even if you start to feel better.  Rinse your mouth (gargle) often with salt water to relieve pain or swelling.  Do not drive or operate heavy machinery while taking pain medicine.  Do not apply heat to the outside of your mouth.  Keep all follow-up visits as directed by your dentist. This is important. Contact a health care provider if:  Your pain is worse  and is not helped by medicine. Get help right away if:  You have a fever or chills.  Your symptoms suddenly get worse.  You have a very bad headache.  You have problems breathing or swallowing.  You have trouble opening your mouth.  You have swelling in your neck or around your eye. This information is not intended to replace advice given to you by your health care provider. Make sure you discuss any questions you have with your health care provider. Document Released: 02/07/2005 Document Revised: 06/18/2015 Document Reviewed: 02/04/2014 Elsevier Interactive Patient Education  2017 [employer]

## 2016-09-26 ENCOUNTER — Ambulatory Visit: Payer: BLUE CROSS/BLUE SHIELD

## 2016-09-26 ENCOUNTER — Ambulatory Visit (INDEPENDENT_AMBULATORY_CARE_PROVIDER_SITE_OTHER): Payer: BLUE CROSS/BLUE SHIELD

## 2016-09-26 ENCOUNTER — Encounter: Payer: Self-pay | Admitting: Sports Medicine

## 2016-09-26 ENCOUNTER — Ambulatory Visit (INDEPENDENT_AMBULATORY_CARE_PROVIDER_SITE_OTHER): Payer: BLUE CROSS/BLUE SHIELD | Admitting: Sports Medicine

## 2016-09-26 DIAGNOSIS — W19XXXA Unspecified fall, initial encounter: Secondary | ICD-10-CM | POA: Insufficient documentation

## 2016-09-26 DIAGNOSIS — R079 Chest pain, unspecified: Secondary | ICD-10-CM

## 2016-09-26 DIAGNOSIS — M25522 Pain in left elbow: Secondary | ICD-10-CM | POA: Diagnosis not present

## 2016-09-26 DIAGNOSIS — M549 Dorsalgia, unspecified: Secondary | ICD-10-CM | POA: Diagnosis not present

## 2016-09-26 MED ORDER — HYDROCODONE-ACETAMINOPHEN 5-325 MG PO TABS: 1.0000 | ORAL_TABLET | Freq: Three times a day (TID) | ORAL | 0 refills | Status: DC | PRN

## 2016-09-26 NOTE — Assessment & Plan Note (Signed)
Fall 3 days ago, right medial epicondylar bruising. Left-sided posterior rib tenderness and bruising question fracture. Right elbow and chest were strapped with compressive dressings. Hydrocodone for pain. X-rays of the lumbar spine, left rib cage and right elbow. Return in 2 weeks.

## 2016-09-26 NOTE — Progress Notes (Signed)
  Subjective:    CC: Recent fall  HPI: This is a pleasant 56 year old male, unfortunately he slipped and fell onto his left chest, right elbow. He now has pain along the left chest, medial right elbow, and mid back. Nothing radicular, pain is moderate, persistent.  Past medical history:  Negative.  See flowsheet/record as well for more information.  Surgical history: Negative.  See flowsheet/record as well for more information.  Family history: Negative.  See flowsheet/record as well for more information.  Social history: Negative.  See flowsheet/record as well for more information.  Allergies, and medications have been entered into the medical record, reviewed, and no changes needed.   Review of Systems: No fevers, chills, night sweats, weight loss, chest pain, or shortness of breath.   Objective:    General: Well Developed, well nourished, and in no acute distress.  Neuro: Alert and oriented x3, extra-ocular muscles intact, sensation grossly intact.  HEENT: Normocephalic, atraumatic, pupils equal round reactive to light, neck supple, no masses, no lymphadenopathy, thyroid nonpalpable.  Skin: Warm and dry, no rashes. Cardiac: Regular rate and rhythm, no murmurs rubs or gallops, no lower extremity edema.  Respiratory: Clear to auscultation bilaterally. Not using accessory muscles, speaking in full sentences. Musculoskeletal: Elbow is bruised at the medial epicondyle on the right, good motion, good strength, all ligaments are stable. He does have some tenderness along the left chest wall, no palpable subcutaneous emphysema, lungs are clear to auscultation bilaterally, he does have tenderness along several of his spinous processes from the upper to mid lumbar spine.  The chest and the right elbow were strapped with compressive dressings  Impression and Recommendations:    Accidental fall Fall 3 days ago, right medial epicondylar bruising. Left-sided posterior rib tenderness and bruising  question fracture. Right elbow and chest were strapped with compressive dressings. Hydrocodone for pain. X-rays of the lumbar spine, left rib cage and right elbow. Return in 2 weeks.  I spent 25 minutes with this patient, greater than 50% was face-to-face time counseling regarding the above diagnoses

## 2016-10-17 ENCOUNTER — Encounter: Payer: Self-pay | Admitting: Sports Medicine

## 2017-03-05 ENCOUNTER — Other Ambulatory Visit: Payer: Self-pay | Admitting: Sports Medicine

## 2017-03-05 DIAGNOSIS — E039 Hypothyroidism, unspecified: Secondary | ICD-10-CM

## 2017-03-05 DIAGNOSIS — E038 Other specified hypothyroidism: Secondary | ICD-10-CM

## 2017-06-10 IMAGING — CR DG CHEST 2V
2 series · 2 of 2 positions shown · non-contrast
Comparison: None.

CLINICAL DATA: Cough.

EXAM:
CHEST  2 VIEW

[chest pa]
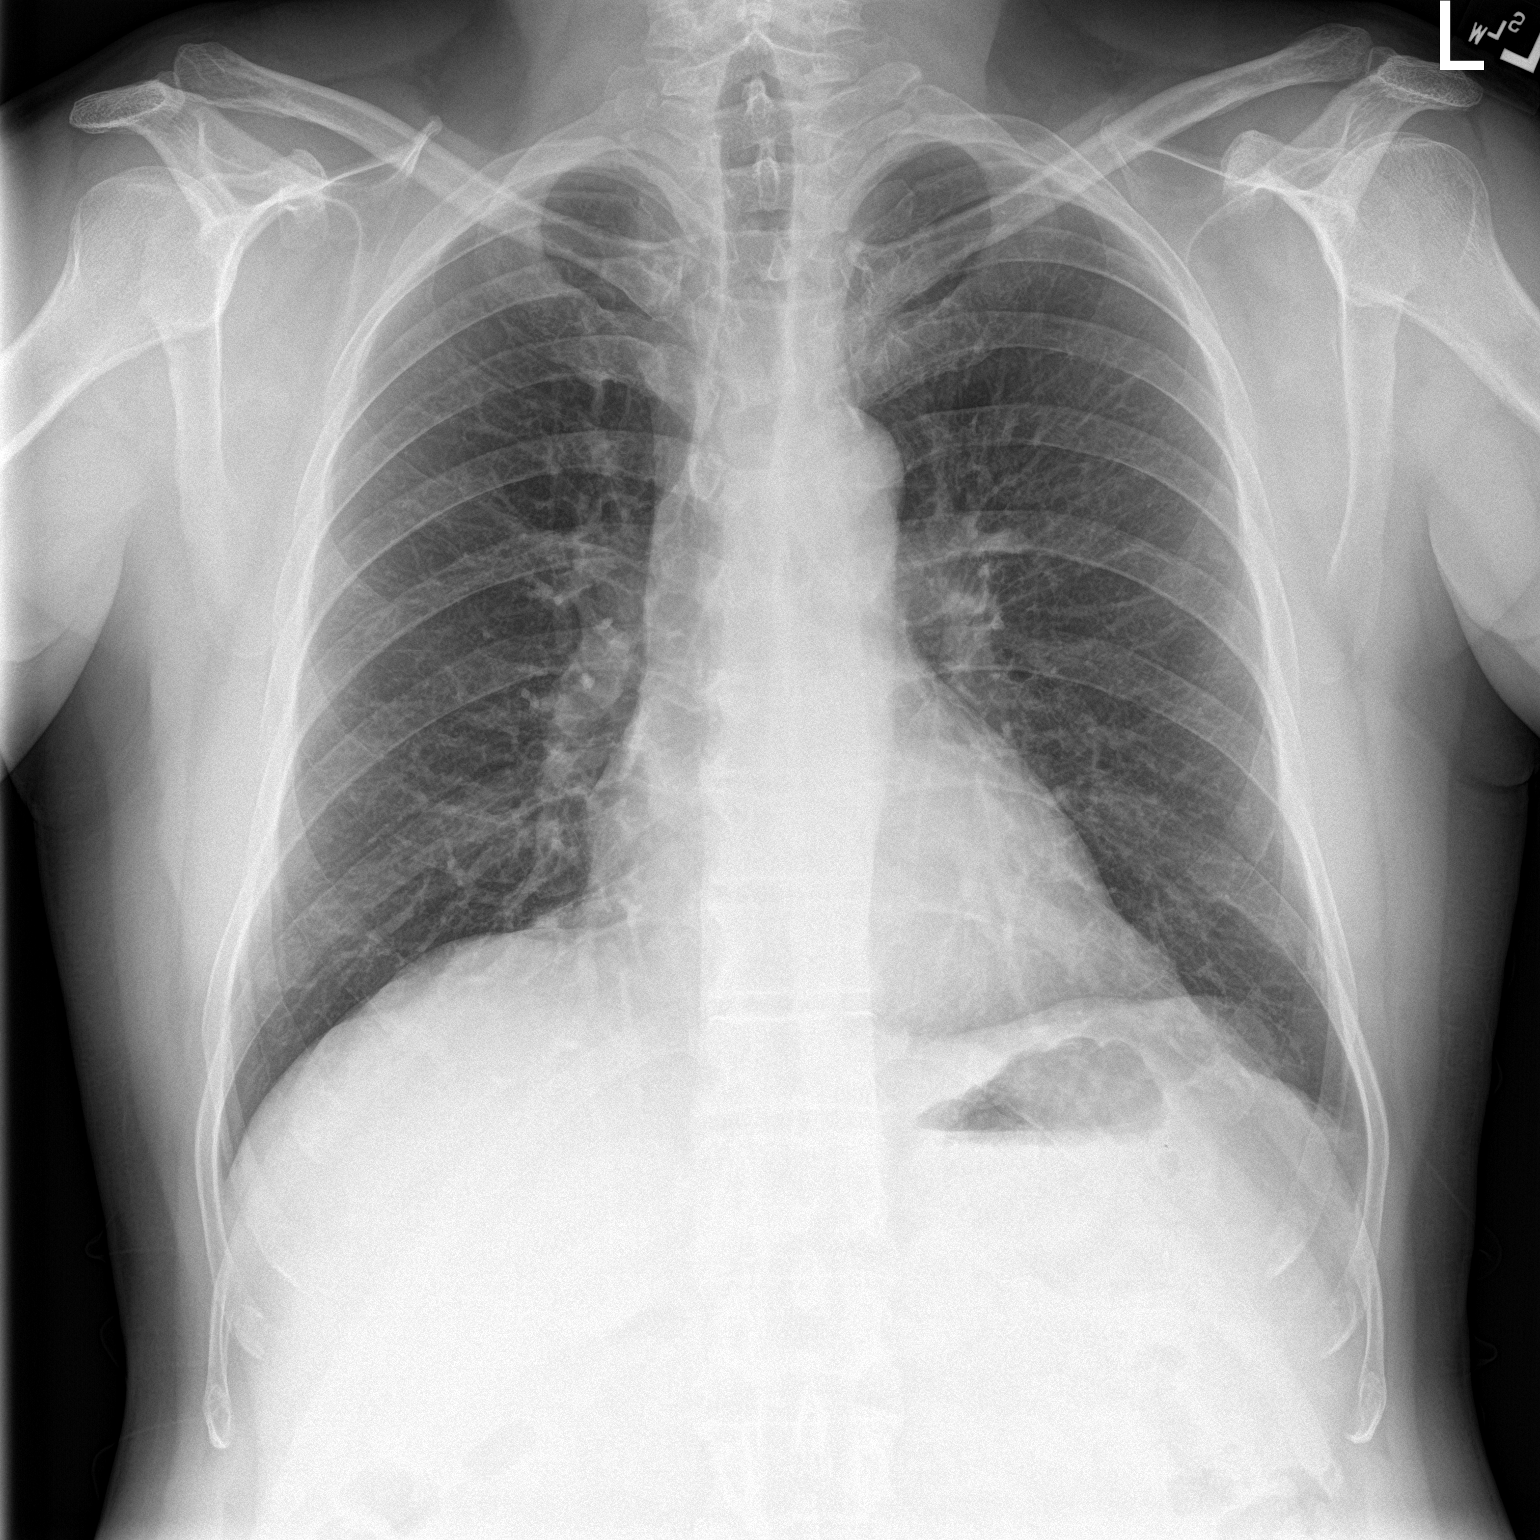

[chest lat]
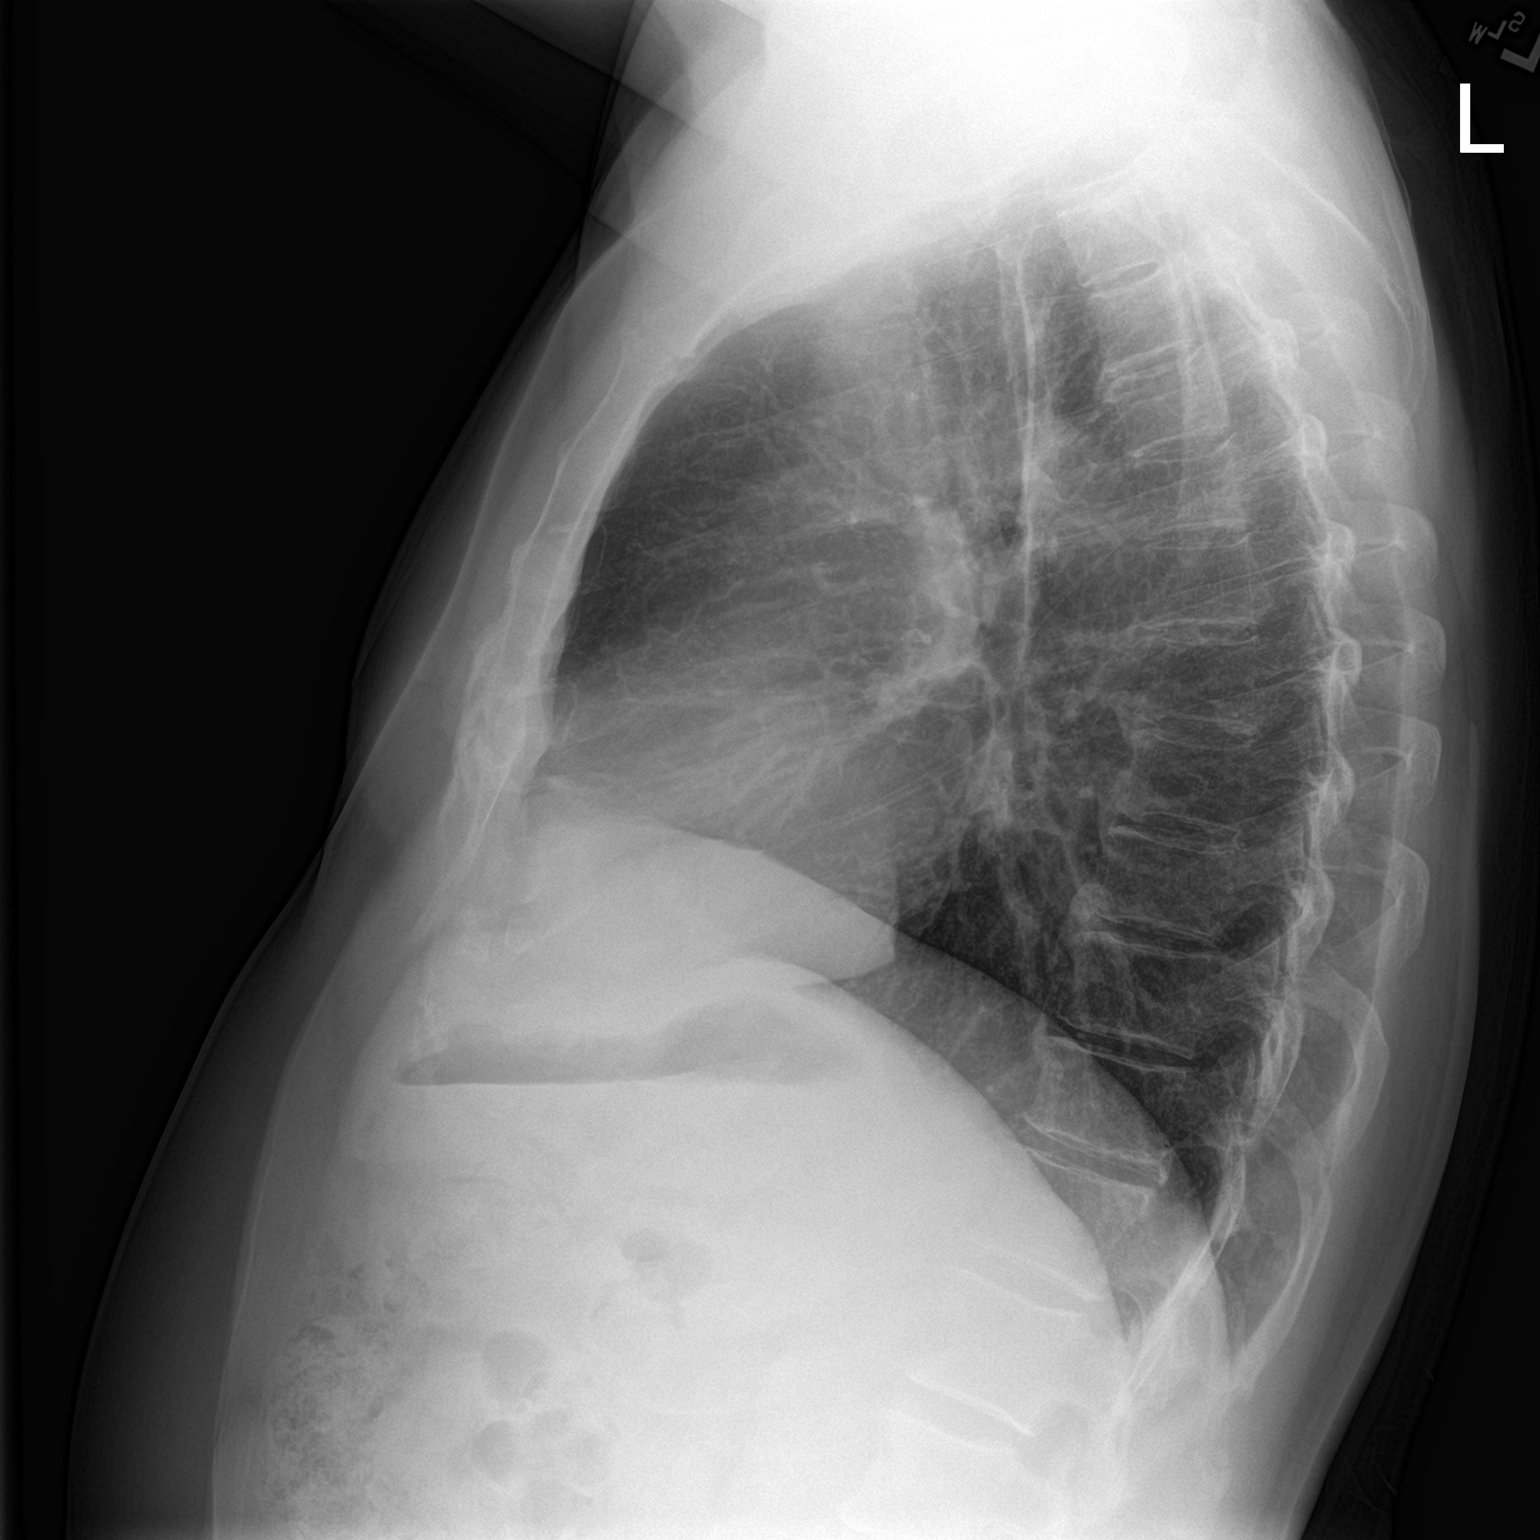

[2 of 2 positions shown; findings below may reference images not displayed]

FINDINGS: The heart size and mediastinal contours are within normal limits.
Both lungs are clear. The visualized skeletal structures are
unremarkable.
IMPRESSION: No active cardiopulmonary disease.

## 2017-06-19 ENCOUNTER — Ambulatory Visit (INDEPENDENT_AMBULATORY_CARE_PROVIDER_SITE_OTHER): Payer: BLUE CROSS/BLUE SHIELD | Admitting: Sports Medicine

## 2017-06-19 ENCOUNTER — Encounter: Payer: Self-pay | Admitting: Sports Medicine

## 2017-06-19 DIAGNOSIS — E785 Hyperlipidemia, unspecified: Secondary | ICD-10-CM | POA: Diagnosis not present

## 2017-06-19 DIAGNOSIS — Z0000 Encounter for general adult medical examination without abnormal findings: Secondary | ICD-10-CM | POA: Diagnosis not present

## 2017-06-19 DIAGNOSIS — R3129 Other microscopic hematuria: Secondary | ICD-10-CM

## 2017-06-19 DIAGNOSIS — E038 Other specified hypothyroidism: Secondary | ICD-10-CM

## 2017-06-19 DIAGNOSIS — R918 Other nonspecific abnormal finding of lung field: Secondary | ICD-10-CM | POA: Diagnosis not present

## 2017-06-19 DIAGNOSIS — E039 Hypothyroidism, unspecified: Secondary | ICD-10-CM

## 2017-06-19 LAB — COMPREHENSIVE METABOLIC PANEL WITH GFR
AG Ratio: 2 (calc) (ref 1.0–2.5)
AST: 21 U/L (ref 10–35)
Alkaline phosphatase (APISO): 55 U/L (ref 40–115)
CO2: 31 mmol/L (ref 20–32)
Chloride: 105 mmol/L (ref 98–110)
Globulin: 2.2 g/dL (ref 1.9–3.7)
Potassium: 4.5 mmol/L (ref 3.5–5.3)

## 2017-06-19 LAB — COMPREHENSIVE METABOLIC PANEL
ALT: 15 U/L (ref 9–46)
Albumin: 4.5 g/dL (ref 3.6–5.1)
BUN: 19 mg/dL (ref 7–25)
Calcium: 9.3 mg/dL (ref 8.6–10.3)
Creat: 1.07 mg/dL (ref 0.70–1.33)
Glucose, Bld: 106 mg/dL — ABNORMAL HIGH (ref 65–99)
Sodium: 141 mmol/L (ref 135–146)
Total Bilirubin: 0.4 mg/dL (ref 0.2–1.2)
Total Protein: 6.7 g/dL (ref 6.1–8.1)

## 2017-06-19 LAB — LIPID PANEL W/REFLEX DIRECT LDL
Cholesterol: 233 mg/dL — ABNORMAL HIGH (ref <200)
HDL: 36 mg/dL — ABNORMAL LOW (ref >40)
LDL Cholesterol (Calc): 163 mg/dL (calc) — ABNORMAL HIGH
Non-HDL Cholesterol (Calc): 197 mg/dL (calc) — ABNORMAL HIGH (ref <130)
Total CHOL/HDL Ratio: 6.5 (calc) — ABNORMAL HIGH (ref <5.0)
Triglycerides: 187 mg/dL — ABNORMAL HIGH (ref <150)

## 2017-06-19 LAB — CBC
HCT: 46.2 % (ref 38.5–50.0)
Hemoglobin: 15.7 g/dL (ref 13.2–17.1)
MCH: 30.5 pg (ref 27.0–33.0)
MCHC: 34 g/dL (ref 32.0–36.0)
MCV: 89.7 fL (ref 80.0–100.0)
MPV: 10.4 fL (ref 7.5–12.5)
Platelets: 179 10*3/uL (ref 140–400)
RBC: 5.15 Million/uL (ref 4.20–5.80)
RDW: 12 % (ref 11.0–15.0)
WBC: 4.2 10*3/uL (ref 3.8–10.8)

## 2017-06-19 NOTE — Progress Notes (Signed)
Subjective:    CC: Establish care.   HPI:    I reviewed the past medical history, family history, social history, surgical history, and allergies today and no changes were needed.  Please see the problem list section below in epic for further details.  Past Medical History: Past Medical History:  Diagnosis Date  . Hyperlipidemia 09/03/2013  . Hypogonadism male 09/03/2013  . Microscopic hematuria 12/30/2013   CT scan shows renal cysts and no other concerning abnormality. This is the likely cause of the hematuria.   . Pulmonary nodules 01/24/2014   Needs repeat CT of the chest in December 2016   . Subclinical hypothyroidism 09/03/2013   Past Surgical History: No past surgical history on file. Social History: Social History   Socioeconomic History  . Marital status: Married    Spouse name: Not on file  . Number of children: Not on file  . Years of education: Not on file  . Highest education level: Not on file  Occupational History  . Not on file  Social Needs  . Financial resource strain: Not on file  . Food insecurity:    Worry: Not on file    Inability: Not on file  . Transportation needs:    Medical: Not on file    Non-medical: Not on file  Tobacco Use  . Smoking status: Never Smoker  . Smokeless tobacco: Never Used  Substance and Sexual Activity  . Alcohol use: Not on file  . Drug use: Not on file  . Sexual activity: Not on file  Lifestyle  . Physical activity:    Days per week: Not on file    Minutes per session: Not on file  . Stress: Not on file  Relationships  . Social connections:    Talks on phone: Not on file    Gets together: Not on file    Attends religious service: Not on file    Active member of club or organization: Not on file    Attends meetings of clubs or organizations: Not on file    Relationship status: Not on file  Other Topics Concern  . Not on file  Social History Narrative  . Not on file   Family History: No family history on  file. Allergies: No Known Allergies Medications: See med rec.  Review of Systems: No headache, visual changes, nausea, vomiting, diarrhea, constipation, dizziness, abdominal pain, skin rash, fevers, chills, night sweats, swollen lymph nodes, weight loss, chest pain, body aches, joint swelling, muscle aches, shortness of breath, mood changes, visual or auditory hallucinations.  Objective:    General: Well Developed, well nourished, and in no acute distress.  Neuro: Alert and oriented x3, extra-ocular muscles intact, sensation grossly intact.  HEENT: Normocephalic, atraumatic, pupils equal round reactive to light, neck supple, no masses, no lymphadenopathy, thyroid nonpalpable.  Skin: Warm and dry, no rashes noted.  Cardiac: Regular rate and rhythm, no murmurs rubs or gallops.  Respiratory: Clear to auscultation bilaterally. Not using accessory muscles, speaking in full sentences.  Abdominal: Soft, nontender, nondistended, positive bowel sounds, no masses, no organomegaly.  Musculoskeletal: Shoulder, elbow, wrist, hip, knee, ankle stable, and with full range of motion.    Impression and Recommendations:    The patient was counselled, risk factors were discussed, anticipatory guidance given.  Annual physical exam Unremarkable physical as above, checking routine labs. Patient desires blood ABO typing  Hyperlipidemia Rechecking routine lipids.  Microscopic hematuria Rechecking urinalysis, renal function has been unremarkable, CT did show some renal cysts  that were the likely cause of his microscopic hematuria. He does still desire to have a second opinion from a nephrologist which I think is appropriate.  Pulmonary nodules Repeat chest CT chest showed stability of the nodules indicating they are benign.  Subclinical hypothyroidism Rechecking TSH, he has been off of the 50 mcg levothyroxine for several months now.   ___________________________________________ Dale Ruiz.  Benjamin Stain, M.D., ABFM., CAQSM. Primary Care and Sports Medicine [facility]  Adjunct Instructor of Family Medicine  [employer]

## 2017-06-19 NOTE — Assessment & Plan Note (Signed)
Rechecking urinalysis, renal function has been unremarkable, CT did show some renal cysts that were the likely cause of his microscopic hematuria. He does still desire to have a second opinion from a nephrologist which I think is appropriate.

## 2017-06-19 NOTE — Assessment & Plan Note (Addendum)
Unremarkable physical as above, checking routine labs. Patient desires blood ABO typing

## 2017-06-19 NOTE — Assessment & Plan Note (Signed)
Rechecking routine lipids.

## 2017-06-19 NOTE — Assessment & Plan Note (Signed)
Rechecking TSH, he has been off of the 50 mcg levothyroxine for several months now.

## 2017-06-19 NOTE — Assessment & Plan Note (Signed)
Repeat chest CT chest showed stability of the nodules indicating they are benign.

## 2017-06-20 ENCOUNTER — Telehealth: Payer: Self-pay | Admitting: Sports Medicine

## 2017-06-20 DIAGNOSIS — R3129 Other microscopic hematuria: Secondary | ICD-10-CM

## 2017-06-20 LAB — URINALYSIS W MICROSCOPIC + REFLEX CULTURE
Bacteria, UA: NONE SEEN /HPF
Bilirubin Urine: NEGATIVE
Glucose, UA: NEGATIVE
Hyaline Cast: NONE SEEN /LPF
Ketones, ur: NEGATIVE
Leukocyte Esterase: NEGATIVE
Nitrites, Initial: NEGATIVE
Specific Gravity, Urine: 1.021 (ref 1.001–1.03)
Squamous Epithelial / HPF: NONE SEEN /HPF (ref <=5)
WBC, UA: NONE SEEN /HPF (ref 0–5)
pH: <=5 (ref 5.0–8.0)

## 2017-06-20 LAB — NO CULTURE INDICATED

## 2017-06-20 LAB — HEMOGLOBIN A1C
Hgb A1c MFr Bld: 5.3 % of total Hgb (ref <5.7)
Mean Plasma Glucose: 105 (calc)
eAG (mmol/L): 5.8 (calc)

## 2017-06-20 LAB — TSH: TSH: 4.8 m[IU]/L — ABNORMAL HIGH (ref 0.40–4.50)

## 2017-06-20 LAB — ABO AND RH

## 2017-06-20 MED ORDER — LEVOTHYROXINE SODIUM 50 MCG PO TABS: 50.0000 ug | ORAL_TABLET | Freq: Every day | ORAL | 3 refills | Status: AC

## 2017-06-20 NOTE — Addendum Note (Signed)
Addended by: Monica Becton on: 06/20/2017 08:21 AM   Modules accepted: Orders

## 2017-06-20 NOTE — Telephone Encounter (Signed)
He demanded nephrology.  Womp.  I'll place referral to urology.

## 2017-06-20 NOTE — Telephone Encounter (Signed)
Dr. Karie Schwalbe.   [facility] called and stated that Mr. Causer diagnoses needed to be treated at a urologist office not a nephrologist office. - CF

## 2017-06-21 NOTE — Telephone Encounter (Signed)
Sent referral to urology

## 2018-01-06 ENCOUNTER — Other Ambulatory Visit: Payer: Self-pay | Admitting: Sports Medicine

## 2018-01-06 DIAGNOSIS — E039 Hypothyroidism, unspecified: Secondary | ICD-10-CM

## 2018-01-06 DIAGNOSIS — E038 Other specified hypothyroidism: Secondary | ICD-10-CM

## 2018-04-06 ENCOUNTER — Other Ambulatory Visit: Payer: Self-pay | Admitting: Sports Medicine

## 2018-04-06 DIAGNOSIS — E039 Hypothyroidism, unspecified: Secondary | ICD-10-CM

## 2018-04-06 DIAGNOSIS — E038 Other specified hypothyroidism: Secondary | ICD-10-CM

## 2018-12-26 IMAGING — DX DG LUMBAR SPINE COMPLETE 4+V
5 series · 5 of 5 positions shown · non-contrast
Comparison: None.

CLINICAL DATA: Slip and fall several days ago with persistent pain,
initial encounter

EXAM:
LUMBAR SPINE - COMPLETE 4+ VIEW

[l-spine ap]
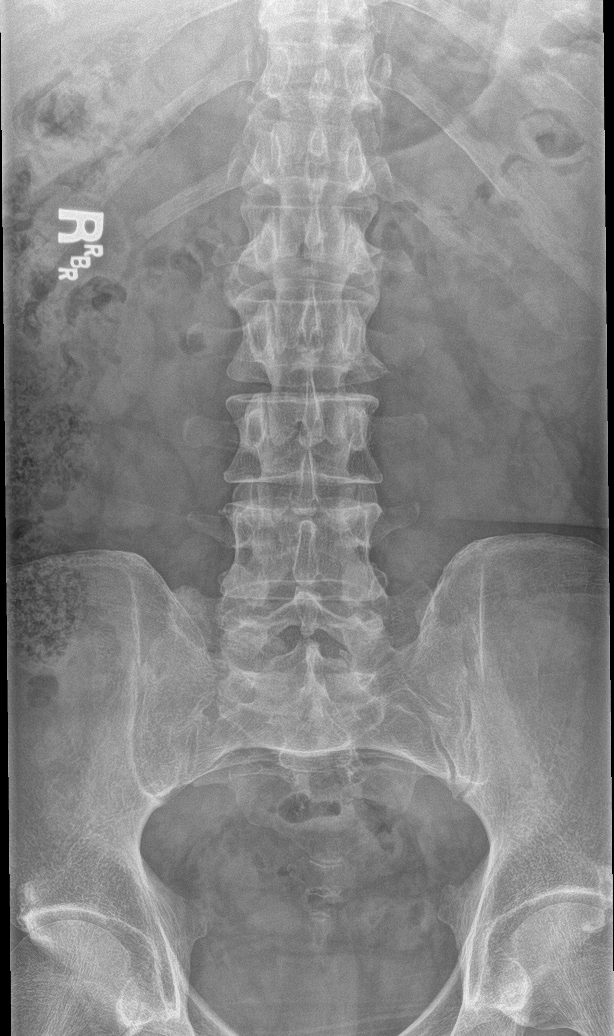

[l-spine obl (1 of 2)]
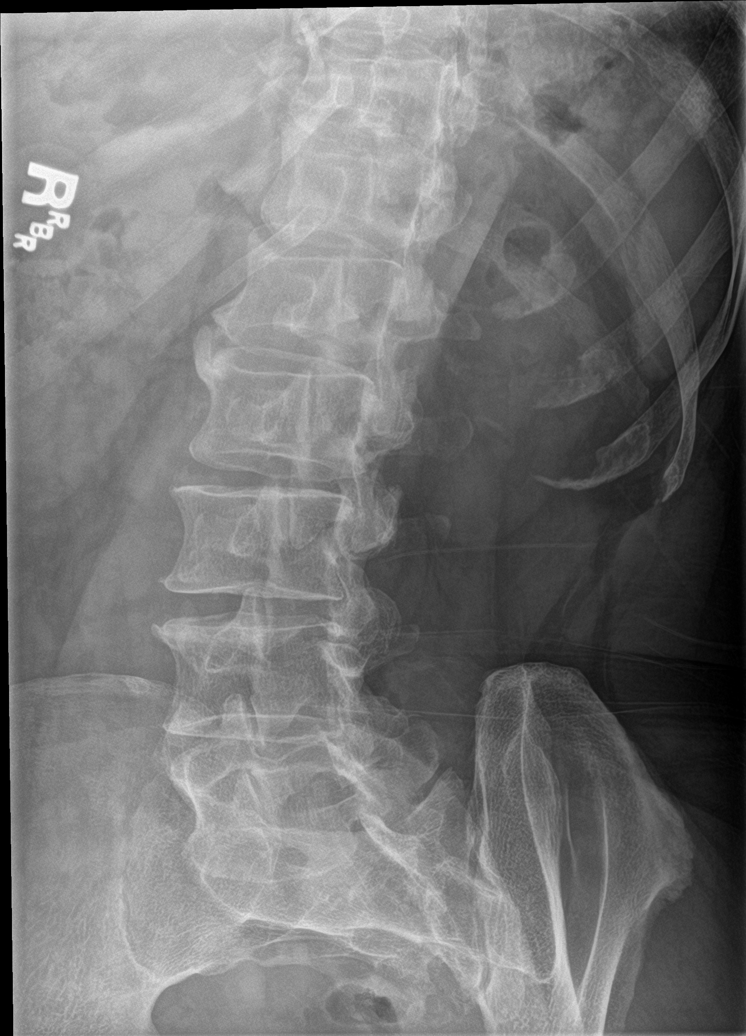

[l-spine obl (2 of 2)]
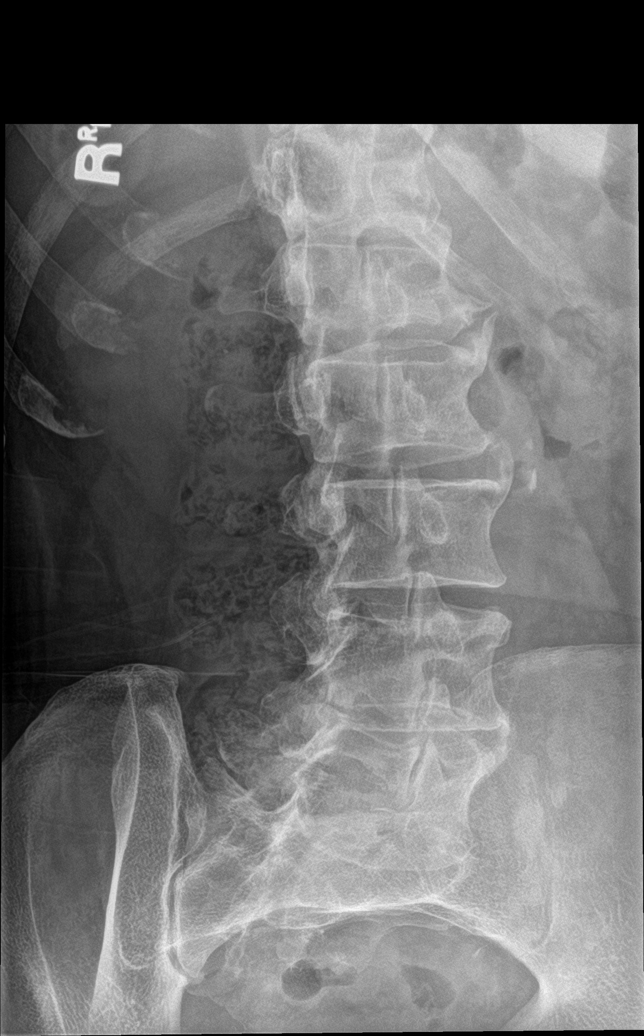

[l-spine lat]
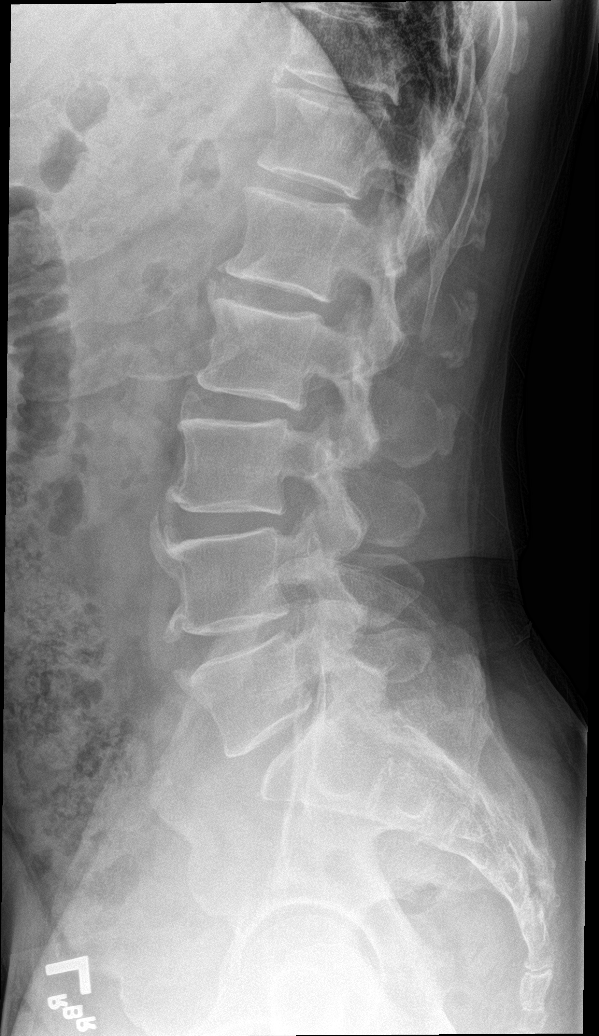

[l-spine spot]
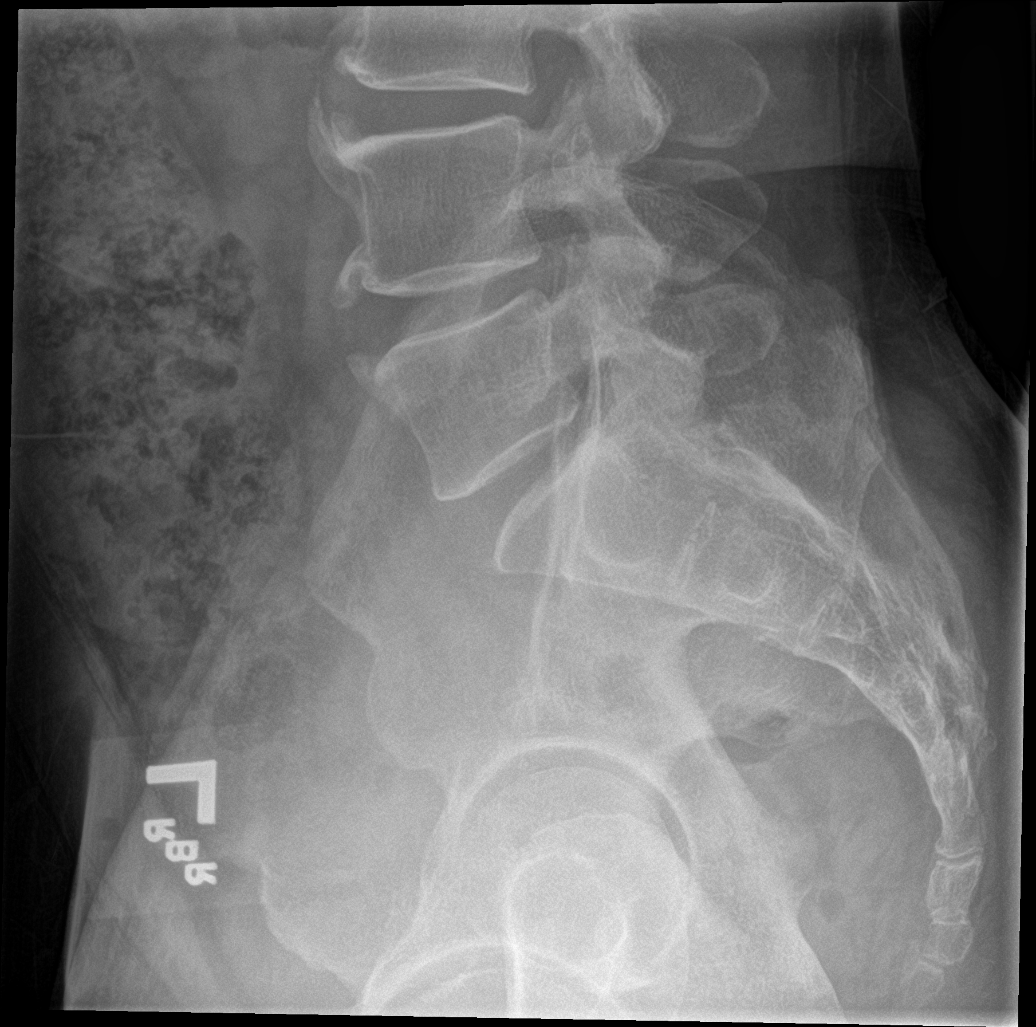

[5 of 5 positions shown; findings below may reference images not displayed]

FINDINGS: Five lumbar type vertebral bodies are well visualized. Vertebral
body height is well maintained. No anterolisthesis is noted. Mild
osteophytic changes are seen. No soft tissue abnormality is noted.
IMPRESSION: Mild degenerative change without acute abnormality.
